# Patient Record
Sex: Female | Born: 1960
Health system: Southern US, Community
[De-identification: ages and names within clinical notes are randomized; demographics above are authoritative.]

## PROBLEM LIST (undated history)

## (undated) DIAGNOSIS — M726 Necrotizing fasciitis: Secondary | ICD-10-CM

## (undated) DIAGNOSIS — R112 Nausea with vomiting, unspecified: Secondary | ICD-10-CM

## (undated) DIAGNOSIS — J42 Unspecified chronic bronchitis: Secondary | ICD-10-CM

## (undated) DIAGNOSIS — J40 Bronchitis, not specified as acute or chronic: Secondary | ICD-10-CM

## (undated) DIAGNOSIS — Z9889 Other specified postprocedural states: Secondary | ICD-10-CM

## (undated) DIAGNOSIS — J189 Pneumonia, unspecified organism: Secondary | ICD-10-CM

## (undated) DIAGNOSIS — I10 Essential (primary) hypertension: Secondary | ICD-10-CM

## (undated) DIAGNOSIS — J45909 Unspecified asthma, uncomplicated: Secondary | ICD-10-CM

## (undated) DIAGNOSIS — S92902A Unspecified fracture of left foot, initial encounter for closed fracture: Secondary | ICD-10-CM

## (undated) DIAGNOSIS — M1990 Unspecified osteoarthritis, unspecified site: Secondary | ICD-10-CM

## (undated) HISTORY — DX: Unspecified chronic bronchitis: J42

## (undated) HISTORY — PX: TONSILLECTOMY: SUR1361

## (undated) HISTORY — PX: OTHER SURGICAL HISTORY: SHX169

## (undated) HISTORY — PX: MANDIBLE SURGERY: SHX707

## (undated) HISTORY — PX: APPENDECTOMY: SHX54

## (undated) HISTORY — DX: Unspecified osteoarthritis, unspecified site: M19.90

## (undated) HISTORY — PX: WISDOM TOOTH EXTRACTION: SHX21

---

## 2017-12-01 ENCOUNTER — Other Ambulatory Visit: Payer: Self-pay

## 2017-12-01 ENCOUNTER — Encounter (HOSPITAL_COMMUNITY): Payer: Self-pay

## 2017-12-01 ENCOUNTER — Emergency Department (HOSPITAL_COMMUNITY)
Admission: EM | Admit: 2017-12-01 | Discharge: 2017-12-01 | Disposition: A | Discharge status: Home / Self Care | Payer: Federal, State, Local not specified - PPO | Attending: Emergency Medicine | Admitting: Emergency Medicine

## 2017-12-01 DIAGNOSIS — N390 Urinary tract infection, site not specified: Secondary | ICD-10-CM | POA: Diagnosis not present

## 2017-12-01 DIAGNOSIS — J45909 Unspecified asthma, uncomplicated: Secondary | ICD-10-CM | POA: Diagnosis not present

## 2017-12-01 DIAGNOSIS — R197 Diarrhea, unspecified: Secondary | ICD-10-CM | POA: Insufficient documentation

## 2017-12-01 DIAGNOSIS — R109 Unspecified abdominal pain: Secondary | ICD-10-CM | POA: Diagnosis present

## 2017-12-01 HISTORY — DX: Unspecified asthma, uncomplicated: J45.909

## 2017-12-01 HISTORY — DX: Necrotizing fasciitis: M72.6

## 2017-12-01 HISTORY — DX: Unspecified fracture of left foot, initial encounter for closed fracture: S92.902A

## 2017-12-01 LAB — URINALYSIS, ROUTINE W REFLEX MICROSCOPIC
Bilirubin Urine: NEGATIVE
GLUCOSE, UA: NEGATIVE mg/dL
HGB URINE DIPSTICK: NEGATIVE
Ketones, ur: NEGATIVE mg/dL
NITRITE: NEGATIVE
PH: 5 (ref 5.0–8.0)
Protein, ur: NEGATIVE mg/dL
SPECIFIC GRAVITY, URINE: 1.012 (ref 1.005–1.030)
WBC, UA: >50 WBC/hpf — ABNORMAL HIGH (ref 0–5)

## 2017-12-01 LAB — COMPREHENSIVE METABOLIC PANEL
ALK PHOS: 73 U/L (ref 38–126)
ALT: 38 U/L (ref 0–44)
AST: 23 U/L (ref 15–41)
Albumin: 4.6 g/dL (ref 3.5–5.0)
Anion gap: 10 (ref 5–15)
BUN: 20 mg/dL (ref 6–20)
CALCIUM: 9.6 mg/dL (ref 8.9–10.3)
CO2: 27 mmol/L (ref 22–32)
CREATININE: 0.7 mg/dL (ref 0.44–1.00)
Chloride: 107 mmol/L (ref 98–111)
Glucose, Bld: 95 mg/dL (ref 70–99)
Potassium: 3.9 mmol/L (ref 3.5–5.1)
Sodium: 144 mmol/L (ref 135–145)
Total Bilirubin: 0.7 mg/dL (ref 0.3–1.2)
Total Protein: 7.6 g/dL (ref 6.5–8.1)

## 2017-12-01 LAB — LIPASE, BLOOD: Lipase: 30 U/L (ref 11–51)

## 2017-12-01 LAB — I-STAT BETA HCG BLOOD, ED (MC, WL, AP ONLY): I-stat hCG, quantitative: <5 m[IU]/mL (ref <5)

## 2017-12-01 LAB — CBC
HCT: 41.3 % (ref 36.0–46.0)
Hemoglobin: 13.6 g/dL (ref 12.0–15.0)
MCH: 26.5 pg (ref 26.0–34.0)
MCHC: 32.9 g/dL (ref 30.0–36.0)
MCV: 80.4 fL (ref 78.0–100.0)
PLATELETS: 353 10*3/uL (ref 150–400)
RBC: 5.14 MIL/uL — AB (ref 3.87–5.11)
RDW: 14.8 % (ref 11.5–15.5)
WBC: 9.2 10*3/uL (ref 4.0–10.5)

## 2017-12-01 LAB — CBG MONITORING, ED: Glucose-Capillary: 90 mg/dL (ref 70–99)

## 2017-12-01 MED ORDER — CEPHALEXIN 500 MG PO CAPS: 500.0000 mg | ORAL_CAPSULE | Freq: Four times a day (QID) | ORAL | 0 refills | Status: DC

## 2017-12-01 MED ORDER — FLUCONAZOLE 150 MG PO TABS: 150.0000 mg | ORAL_TABLET | Freq: Once | ORAL | 0 refills | Status: AC

## 2017-12-01 MED ORDER — CEPHALEXIN 500 MG PO CAPS
500.0000 mg | ORAL_CAPSULE | Freq: Once | ORAL | Status: AC
  Administered 2017-12-01: 500 mg via ORAL
  Filled 2017-12-01: qty 1

## 2017-12-01 MED ORDER — FLUCONAZOLE 150 MG PO TABS
150.0000 mg | ORAL_TABLET | Freq: Once | ORAL | Status: AC
  Administered 2017-12-01: 150 mg via ORAL
  Filled 2017-12-01: qty 1

## 2017-12-01 NOTE — ED Provider Notes (Signed)
[facility]-EMERGENCY DEPT Provider Note   CSN: [id number] Arrival date & time: 12/01/17  1558     History   Chief Complaint Chief Complaint  Patient presents with  . Diarrhea    HPI Toni Jones is a 57 y.o. female.  The history is provided by the patient and medical records. No language interpreter was used.  Diarrhea   Associated symptoms include abdominal pain. Pertinent negatives include no vomiting.   Toni Jones is a 57 y.o. female presents to the emergency department for suprapubic pain for the last 2 weeks.  Associated with some dysuria, however this would improve when she drinks cranberry juice.  The symptoms have been waxing and waning.  Last week, she developed intermittent loose stools.  One day, she had up to 10 episodes of loose stool, however the next day, she only had 2 bowel movements which were more firm.  She had 3 bowel movements this morning, all were formed, however softer than usual.  She has been drinking Kombucha for the probiotic effect.  Denies any fever, chills, back pain, vaginal discharge, nausea, vomiting.  Past Medical History:  Diagnosis Date  . Asthma   . Flesh-eating bacteria (HCC)    [place]  . Foot fracture, left     There are no active problems to display for this patient.   Past Surgical History:  Procedure Laterality Date  . APPENDECTOMY    . TONSILLECTOMY       OB History   None      Home Medications    Prior to Admission medications   Medication Sig Start Date End Date Taking? Authorizing Provider  naproxen sodium (ALEVE) 220 MG tablet Take 220 mg by mouth daily as needed (pain).   Yes [provider]  cephALEXin (KEFLEX) 500 MG capsule Take 1 capsule (500 mg total) by mouth 4 (four) times daily. 12/01/17   Ward, Chase Picket, PA-C  fluconazole (DIFLUCAN) 150 MG tablet Take 1 tablet (150 mg total) by mouth once for 1 dose. Take 1 tablet (150 mg total) by mouth once in 3 days.  12/01/17 12/01/17  Ward, Chase Picket, PA-C    Family History No family history on file.  Social History Social History   Tobacco Use  . Smoking status: Never Smoker  . Smokeless tobacco: Never Used  Substance Use Topics  . Alcohol use: Never    Frequency: Never  . Drug use: Never     Allergies   Onion and Red dye #40 [red dye]   Review of Systems Review of Systems  Gastrointestinal: Positive for abdominal pain and diarrhea. Negative for constipation, nausea and vomiting.  Genitourinary: Positive for dysuria. Negative for vaginal discharge.  All other systems reviewed and are negative.    Physical Exam Updated Vital Signs BP (!) 154/82 (BP Location: Left Arm)   Pulse 65   Temp 98.4 F (36.9 C) (Oral)   Resp 16   Ht 5\' 6"  (1.676 m)   Wt 96.4 kg   SpO2 98%   BMI 34.31 kg/m   Physical Exam  Constitutional: She is oriented to person, place, and time. She appears well-developed and well-nourished. No distress.  Well-appearing.  HENT:  Head: Normocephalic and atraumatic.  Neck: Neck supple.  Cardiovascular: Normal rate, regular rhythm and normal heart sounds.  No murmur heard. Pulmonary/Chest: Effort normal and breath sounds normal. No respiratory distress.  Abdominal: Soft. Bowel sounds are normal. She exhibits no distension.  Mild suprapubic tenderness without rebound  or guarding.  No CVA tenderness.  Musculoskeletal: Normal range of motion.  Neurological: She is alert and oriented to person, place, and time.  Skin: Skin is warm and dry.  Nursing note and vitals reviewed.    ED Treatments / Results  Labs (all labs ordered are listed, but only abnormal results are displayed) Labs Reviewed  CBC - Abnormal; Notable for the following components:      Result Value   RBC 5.14 (*)    All other components within normal limits  URINALYSIS, ROUTINE W REFLEX MICROSCOPIC - Abnormal; Notable for the following components:   Leukocytes, UA LARGE (*)    WBC, UA >50  (*)    Bacteria, UA RARE (*)    All other components within normal limits  LIPASE, BLOOD  COMPREHENSIVE METABOLIC PANEL  CBG MONITORING, ED  I-STAT BETA HCG BLOOD, ED ([facility], [facility], [facility] ONLY)    EKG None  Radiology No results found.  Procedures Procedures (including critical care time)  Medications Ordered in ED Medications  cephALEXin (KEFLEX) capsule 500 mg (has no administration in time range)  fluconazole (DIFLUCAN) tablet 150 mg (has no administration in time range)     Initial Impression / Assessment and Plan / ED Course  I have reviewed the triage vital signs and the nursing notes.  Pertinent labs & imaging results that were available during my care of the patient were reviewed by me and considered in my medical decision making (see chart for details).    Toni Jones is a 57 y.o. female who presents to ED for suprapubic pain, dysuria and intermittent diarrhea over the last 2 weeks.  On exam, patient is afebrile, hemodynamically stable, well-appearing with mild suprapubic tenderness.  Nonsurgical abdomen.  No CVA tenderness.  Blood work reviewed and reassuring with normal white count.  UA consistent with UTI showing large leukocytes and greater than 50 WBCs.  Yeast also present.  Given dose of Diflucan in the ER and Rx for 1 pill to take in 3 days to treat for yeast.  Will prescribe Keflex for UTI.  PCP follow-up if no improvement in 3 days.  Reasons to return to the ER including fever, back pain, vomiting, worsening abdominal pain, new symptoms or any additional concerns discussed.  All questions answered.  Final Clinical Impressions(s) / ED Diagnoses   Final diagnoses:  Lower urinary tract infection    ED Discharge Orders         Ordered    cephALEXin (KEFLEX) 500 MG capsule  4 times daily     12/01/17 2055    fluconazole (DIFLUCAN) 150 MG tablet   Once     12/01/17 2055           Ward, Chase Picket, PA-C 12/01/17 2144    Benjiman Core, MD 12/01/17  [phone]

## 2017-12-01 NOTE — Discharge Instructions (Signed)
It was my pleasure taking care of you today!   Please take all of your antibiotics until finished!  Start using Monostat (vaginal yeast cream) for the next week.   Follow up with your primary care doctor if symptoms are not improving in the next 3 days.   Return to ER for fever, back pain, vomiting, new or worsening symptoms, any additional concerns.

## 2017-12-01 NOTE — ED Triage Notes (Signed)
Pt states that [place] drove [place] country. Pt states that she has been here 3 weeks. Pt states she has had diarrhea since arriving. Pt states that she has been sticking to bottled water, after thinking that she had issues with the water supply. Pt states that she has been trying probiotics without success. Pt states that she also switched from working days to nights.

## 2018-04-06 DIAGNOSIS — K080 Exfoliation of teeth due to systemic causes: Secondary | ICD-10-CM | POA: Diagnosis not present

## 2018-08-01 ENCOUNTER — Other Ambulatory Visit: Payer: Self-pay

## 2018-08-01 ENCOUNTER — Emergency Department (HOSPITAL_COMMUNITY)
Admission: EM | Admit: 2018-08-01 | Discharge: 2018-08-01 | Disposition: A | Discharge status: Home / Self Care | Payer: Federal, State, Local not specified - PPO | Attending: Emergency Medicine | Admitting: Emergency Medicine

## 2018-08-01 ENCOUNTER — Emergency Department (HOSPITAL_COMMUNITY): Payer: Federal, State, Local not specified - PPO

## 2018-08-01 DIAGNOSIS — J40 Bronchitis, not specified as acute or chronic: Secondary | ICD-10-CM

## 2018-08-01 DIAGNOSIS — Z20822 Contact with and (suspected) exposure to covid-19: Secondary | ICD-10-CM

## 2018-08-01 DIAGNOSIS — Z1159 Encounter for screening for other viral diseases: Secondary | ICD-10-CM | POA: Diagnosis not present

## 2018-08-01 DIAGNOSIS — R0602 Shortness of breath: Secondary | ICD-10-CM | POA: Diagnosis not present

## 2018-08-01 DIAGNOSIS — J4521 Mild intermittent asthma with (acute) exacerbation: Secondary | ICD-10-CM | POA: Insufficient documentation

## 2018-08-01 DIAGNOSIS — Z20828 Contact with and (suspected) exposure to other viral communicable diseases: Secondary | ICD-10-CM | POA: Diagnosis not present

## 2018-08-01 LAB — BASIC METABOLIC PANEL
Anion gap: 9 (ref 5–15)
BUN: 21 mg/dL — ABNORMAL HIGH (ref 6–20)
CO2: 25 mmol/L (ref 22–32)
Calcium: 9.6 mg/dL (ref 8.9–10.3)
Chloride: 106 mmol/L (ref 98–111)
Creatinine, Ser: 0.88 mg/dL (ref 0.44–1.00)
GFR calc Af Amer: >60 mL/min (ref >60)
GFR calc non Af Amer: >60 mL/min (ref >60)
Glucose, Bld: 99 mg/dL (ref 70–99)
Potassium: 4 mmol/L (ref 3.5–5.1)
Sodium: 140 mmol/L (ref 135–145)

## 2018-08-01 LAB — TROPONIN I: Troponin I: <0.03 ng/mL (ref <0.03)

## 2018-08-01 LAB — SARS CORONAVIRUS 2 BY RT PCR (HOSPITAL ORDER, PERFORMED IN ~~LOC~~ HOSPITAL LAB): SARS Coronavirus 2: NEGATIVE

## 2018-08-01 LAB — BRAIN NATRIURETIC PEPTIDE: B Natriuretic Peptide: 54 pg/mL (ref 0.0–100.0)

## 2018-08-01 MED ORDER — ALBUTEROL SULFATE HFA 108 (90 BASE) MCG/ACT IN AERS
4.0000 | INHALATION_SPRAY | Freq: Once | RESPIRATORY_TRACT | Status: AC
  Administered 2018-08-01: 10:00:00 4 via RESPIRATORY_TRACT
  Filled 2018-08-01: qty 6.7

## 2018-08-01 MED ORDER — AZITHROMYCIN 250 MG PO TABS
500.0000 mg | ORAL_TABLET | Freq: Once | ORAL | Status: AC
  Administered 2018-08-01: 500 mg via ORAL
  Filled 2018-08-01: qty 2

## 2018-08-01 MED ORDER — ALBUTEROL SULFATE HFA 108 (90 BASE) MCG/ACT IN AERS: 1.0000 | INHALATION_SPRAY | Freq: Four times a day (QID) | RESPIRATORY_TRACT | 0 refills | Status: DC | PRN

## 2018-08-01 MED ORDER — AZITHROMYCIN 250 MG PO TABS: ORAL_TABLET | ORAL | 0 refills | Status: DC

## 2018-08-01 MED ORDER — AEROCHAMBER Z-STAT PLUS/MEDIUM MISC
1.0000 | Freq: Once | Status: AC
  Administered 2018-08-01: 1
  Filled 2018-08-01: qty 1

## 2018-08-01 MED ORDER — METHYLPREDNISOLONE SODIUM SUCC 125 MG IJ SOLR
125.0000 mg | Freq: Once | INTRAMUSCULAR | Status: AC
  Administered 2018-08-01: 125 mg via INTRAVENOUS
  Filled 2018-08-01: qty 2

## 2018-08-01 MED ORDER — PREDNISONE 10 MG (21) PO TBPK: ORAL_TABLET | ORAL | 0 refills | Status: DC

## 2018-08-01 NOTE — ED Provider Notes (Signed)
[facility]-EMERGENCY DEPT Provider Note   CSN: [id number] Arrival date & time: 08/01/18  0940    History   Chief Complaint Chief Complaint  Patient presents with  . Shortness of Breath    hx ashtma    HPI Toni Jones is a 58 y.o. female.     Pt presents to the ED today with sob.  She has a hx of asthma and feels like she is having an exacerbation of her asthma.  She has been using her inhaler, but it has not been helping.  She works for [employer], and is worried she's been exposed to [employer].  She has not had any f/c.  She has no known exposures to covid.     Past Medical History:  Diagnosis Date  . Asthma   . Flesh-eating bacteria (HCC)    [place]  . Foot fracture, left     There are no active problems to display for this patient.   Past Surgical History:  Procedure Laterality Date  . APPENDECTOMY    . TONSILLECTOMY       OB History   No obstetric history on file.      Home Medications    Prior to Admission medications   Medication Sig Start Date End Date Taking? Authorizing Provider  albuterol (VENTOLIN HFA) 108 (90 Base) MCG/ACT inhaler Inhale 1-2 puffs into the lungs every 6 (six) hours as needed for wheezing or shortness of breath. 08/01/18   Jacalyn Lefevre, MD  azithromycin (ZITHROMAX) 250 MG tablet Take 1 every day until finished. 08/01/18   Jacalyn Lefevre, MD  cephALEXin (KEFLEX) 500 MG capsule Take 1 capsule (500 mg total) by mouth 4 (four) times daily. Patient not taking: Reported on 08/01/2018 12/01/17   Ward, Chase Picket, PA-C  naproxen sodium (ALEVE) 220 MG tablet Take 220 mg by mouth daily as needed (pain).    [provider]  predniSONE (STERAPRED UNI-PAK 21 TAB) 10 MG (21) TBPK tablet Take 6 tabs for 2 days, then 5 for 2 days, then 4 for 2 days, then 3 for 2 days, 2 for 2 days, then 1 for 2 days 08/01/18   Jacalyn Lefevre, MD    Family History No family history on file.  Social History Social  History   Tobacco Use  . Smoking status: Never Smoker  . Smokeless tobacco: Never Used  Substance Use Topics  . Alcohol use: Never    Frequency: Never  . Drug use: Never     Allergies   Onion and Red dye #40 [red dye]   Review of Systems Review of Systems  Respiratory: Positive for shortness of breath and wheezing.   All other systems reviewed and are negative.    Physical Exam Updated Vital Signs BP (!) 149/74   Pulse 72   Temp 98 F (36.7 C) (Oral)   Resp (!) 30   Ht 5\' 6"  (1.676 m)   Wt 99.8 kg   SpO2 98%   BMI 35.51 kg/m   Physical Exam Vitals signs and nursing note reviewed.  Constitutional:      Appearance: She is well-developed.  HENT:     Head: Normocephalic and atraumatic.     Mouth/Throat:     Mouth: Mucous membranes are moist.     Pharynx: Oropharynx is clear.  Eyes:     Extraocular Movements: Extraocular movements intact.     Pupils: Pupils are equal, round, and reactive to light.  Neck:  Musculoskeletal: Normal range of motion and neck supple.  Cardiovascular:     Rate and Rhythm: Normal rate and regular rhythm.  Pulmonary:     Effort: Tachypnea present.     Breath sounds: Wheezing present.  Abdominal:     General: Bowel sounds are normal.     Palpations: Abdomen is soft.  Musculoskeletal: Normal range of motion.  Skin:    General: Skin is warm and dry.     Capillary Refill: Capillary refill takes less than 2 seconds.  Neurological:     General: No focal deficit present.     Mental Status: She is alert and oriented to person, place, and time.  Psychiatric:        Mood and Affect: Mood normal.        Behavior: Behavior normal.      ED Treatments / Results  Labs (all labs ordered are listed, but only abnormal results are displayed) Labs Reviewed  BASIC METABOLIC PANEL - Abnormal; Notable for the following components:      Result Value   BUN 21 (*)    All other components within normal limits  SARS CORONAVIRUS 2 (HOSPITAL  ORDER, PERFORMED IN [facility])  BRAIN NATRIURETIC PEPTIDE  TROPONIN I    EKG EKG Interpretation  Date/Time:  Tuesday Aug 01 2018 10:23:41 EDT Ventricular Rate:  69 PR Interval:    QRS Duration: 94 QT Interval:  392 QTC Calculation: 420 R Axis:   14 Text Interpretation:  Sinus rhythm Confirmed by Jacalyn Lefevre [phone]) on 08/01/2018 10:41:22 AM   Radiology Dg Chest Port 1 View  Result Date: 08/01/2018 CLINICAL DATA:  Shortness of breath over the last several days. History of asthma. EXAM: PORTABLE CHEST 1 VIEW COMPARISON:  None. FINDINGS: 1014 hours. The heart size and mediastinal contours are normal. The lungs are clear. There is no pleural effusion or pneumothorax. No acute osseous findings are identified. Telemetry leads overlie the chest. IMPRESSION: No active cardiopulmonary process. Electronically Signed   By: Carey Bullocks M.D.   On: 08/01/2018 10:58    Procedures Procedures (including critical care time)  Medications Ordered in ED Medications  albuterol (VENTOLIN HFA) 108 (90 Base) MCG/ACT inhaler 4 puff (4 puffs Inhalation Given 08/01/18 1026)  aerochamber Z-Stat Plus/medium 1 each (1 each Other Given 08/01/18 1026)  methylPREDNISolone sodium succinate (SOLU-MEDROL) 125 mg/2 mL injection 125 mg (125 mg Intravenous Given 08/01/18 1027)  azithromycin (ZITHROMAX) tablet 500 mg (500 mg Oral Given 08/01/18 1201)     Initial Impression / Assessment and Plan / ED Course  I have reviewed the triage vital signs and the nursing notes.  Pertinent labs & imaging results that were available during my care of the patient were reviewed by me and considered in my medical decision making (see chart for details).    Pt is feeling much better after albuterol and solumedrol.  Covid is negative.  Pt's CXR is negative.  She has had productive sputum, so she will be started on a zpack.  She is d/c home with prednisone, zithromax, and another rx for albuterol.    She knows to  return if worse.  F/u with pcp when she gets home.  Toni Jones was evaluated in Emergency Department on 08/01/2018 for the symptoms described in the history of present illness. She was evaluated in the context of the global COVID-19 pandemic, which necessitated consideration that the patient might be at risk for infection with the SARS-CoV-2 virus that causes COVID-19. Institutional protocols  and algorithms that pertain to the evaluation of patients at risk for COVID-19 are in a state of rapid change based on information released by regulatory bodies including the CDC and federal and state organizations. These policies and algorithms were followed during the patient's care in the ED.  Final Clinical Impressions(s) / ED Diagnoses   Final diagnoses:  Mild intermittent asthma with exacerbation  Covid-19 Virus not Detected  Bronchitis    ED Discharge Orders         Ordered    predniSONE (STERAPRED UNI-PAK 21 TAB) 10 MG (21) TBPK tablet     08/01/18 1151    azithromycin (ZITHROMAX) 250 MG tablet     08/01/18 1151    albuterol (VENTOLIN HFA) 108 (90 Base) MCG/ACT inhaler  Every 6 hours PRN     08/01/18 1154           Jacalyn LefevreHaviland, [place], MD 08/01/18 1207

## 2018-08-01 NOTE — ED Notes (Signed)
Pt d/c home per MD order. Discharge summary reviewed, pt verbalizes understanding. RX provided. Pt ambulatory off unit. No s/s of distress noted.

## 2018-08-01 NOTE — ED Triage Notes (Signed)
Pt to ED from home, reports shortness of breath over past couple days, reports hx of asthma. Reports she feels she has bronchitis. Reports she works for [employer] and her employer is concerned d/t contact with general public. Pt reports to her knowledge she has not been around anyone with Covid-19. Pt denies fever, loss of smell, N&V, sore throat. Reports SHOB, cough, congestion.

## 2018-09-27 DIAGNOSIS — I10 Essential (primary) hypertension: Secondary | ICD-10-CM | POA: Diagnosis not present

## 2018-09-27 DIAGNOSIS — R079 Chest pain, unspecified: Secondary | ICD-10-CM | POA: Diagnosis not present

## 2018-09-27 DIAGNOSIS — H538 Other visual disturbances: Secondary | ICD-10-CM | POA: Diagnosis not present

## 2018-12-07 ENCOUNTER — Encounter (HOSPITAL_COMMUNITY): Payer: Self-pay | Admitting: Emergency Medicine

## 2018-12-07 ENCOUNTER — Emergency Department (HOSPITAL_COMMUNITY)
Admission: EM | Admit: 2018-12-07 | Discharge: 2018-12-07 | Disposition: A | Discharge status: Home / Self Care | Payer: Federal, State, Local not specified - PPO | Attending: Emergency Medicine | Admitting: Emergency Medicine

## 2018-12-07 ENCOUNTER — Other Ambulatory Visit: Payer: Self-pay

## 2018-12-07 DIAGNOSIS — R05 Cough: Secondary | ICD-10-CM | POA: Diagnosis not present

## 2018-12-07 DIAGNOSIS — Z20828 Contact with and (suspected) exposure to other viral communicable diseases: Secondary | ICD-10-CM | POA: Diagnosis not present

## 2018-12-07 DIAGNOSIS — R197 Diarrhea, unspecified: Secondary | ICD-10-CM | POA: Insufficient documentation

## 2018-12-07 DIAGNOSIS — R059 Cough, unspecified: Secondary | ICD-10-CM

## 2018-12-07 LAB — SARS CORONAVIRUS 2 BY RT PCR (HOSPITAL ORDER, PERFORMED IN ~~LOC~~ HOSPITAL LAB): SARS Coronavirus 2: NEGATIVE

## 2018-12-07 MED ORDER — PREDNISONE 10 MG (21) PO TBPK: ORAL_TABLET | Freq: Every day | ORAL | 0 refills | Status: DC

## 2018-12-07 MED ORDER — ALBUTEROL SULFATE HFA 108 (90 BASE) MCG/ACT IN AERS
2.0000 | INHALATION_SPRAY | RESPIRATORY_TRACT | Status: DC
  Administered 2018-12-07: 2 via RESPIRATORY_TRACT
  Filled 2018-12-07: qty 6.7

## 2018-12-07 NOTE — Discharge Instructions (Addendum)
You were seen in the ED for cough.  I suspect you have a virus. We tested your for COVID-19 (coronavirus) infection but this was NEGATIVE. It is possible you have another virus infection.  It is also possible this was early in the illness and this test is FALSE NEGATIVE.  If symptoms are not improving you may need to get re-tested.  Since COVID test is negative treatment of your illness and symptoms will include self-isolation for the next 2-3 days, monitoring of symptoms and supportive care with over-the-counter medicines.    Return to the ED if there is increased work of breathing, shortness of breath, inability to tolerate fluids, weakness, chest pain.  If your test is NEGATIVE, you may return to work and essential activities as long as your symptoms have improved and you do not have a fever for a total of 3 days.  Call your job and notify them that your test result was negative to see if they will allow you to return to work.   Stay well-hydrated. Rest. You can use over the counter medications to help with symptoms: 600 mg ibuprofen (motrin, aleve, advil) or acetaminophen (tylenol) every 6 hours, around the clock to help with associated fevers, sore throat, headaches, generalized body aches and malaise.  Oxymetazoline (afrin) intranasal spray once daily for no more than 3 days to help with congestion, after 3 days you can switch to another over-the-counter nasal steroid spray such as fluticasone (flonase) Allergy medication (loratadine, cetirizine, etc) and phenylephrine (sudafed) help with nasal congestion, runny nose and postnasal drip.   Dextromethorphan (Delsym) to suppress dry cough. Frequent coughing is likely causing your chest wall pain Wash your hands often to prevent spread.   Use your albuterol inhaler every 4 hours for cough. Take over the counter delsym to suppress cough.  We will cover you with a short course of steroids for possible early asthma flare.    Infection Prevention  Recommendations for Individuals Confirmed to have, or Being Evaluated for, or have symptoms of 2019 Novel Coronavirus (COVID-19) Infection Who Receive Care at Home  Individuals who are confirmed to have, or are being evaluated for, COVID-19 should follow the prevention steps below until a healthcare provider or local or state health department says they can return to normal activities.  Stay home except to get medical care You should restrict activities outside your home, except for getting medical care. Do not go to work, school, or public areas, and do not use public transportation or taxis.  Call ahead before visiting your doctor Before your medical appointment, call the healthcare provider and tell them that you have, or are being evaluated for, COVID-19 infection. This will help the healthcare providers office take steps to keep other people from getting infected. Ask your healthcare provider to call the local or state health department.  Monitor your symptoms Seek prompt medical attention if your illness is worsening (e.g., difficulty breathing). Before going to your medical appointment, call the healthcare provider and tell them that you have, or are being evaluated for, COVID-19 infection. Ask your healthcare provider to call the local or state health department.  Wear a facemask You should wear a facemask that covers your nose and mouth when you are in the same room with other people and when you visit a healthcare provider. People who live with or visit you should also wear a facemask while they are in the same room with you.  Separate yourself from other people in your home As  much as possible, you should stay in a different room from other people in your home. Also, you should use a separate bathroom, if available.  Avoid sharing household items You should not share dishes, drinking glasses, cups, eating utensils, towels, bedding, or other items with other people in your home.  After using these items, you should wash them thoroughly with soap and water.  Cover your coughs and sneezes Cover your mouth and nose with a tissue when you cough or sneeze, or you can cough or sneeze into your sleeve. Throw used tissues in a lined trash can, and immediately wash your hands with soap and water for at least 20 seconds or use an alcohol-based hand rub.  Wash your [employer] Corporationhands Wash your hands often and thoroughly with soap and water for at least 20 seconds. You can use an alcohol-based hand sanitizer if soap and water are not available and if your hands are not visibly dirty. Avoid touching your eyes, nose, and mouth with unwashed hands.   Prevention Steps for Caregivers and Household Members of Individuals Confirmed to have, or Being Evaluated for, or have symptoms of 2019 Novel Coronavirus (COVID-19) Infection Being Cared for in the Home  If you live with, or provide care at home for, a person confirmed to have, or being evaluated for, COVID-19 infection please follow these guidelines to prevent infection:  Follow healthcare providers instructions Make sure that you understand and can help the patient follow any healthcare provider instructions for all care.  Provide for the patients basic needs You should help the patient with basic needs in the home and provide support for getting groceries, prescriptions, and other personal needs.  Monitor the patients symptoms If they are getting sicker, call his or her medical provider and tell them that the patient has, or is being evaluated for, COVID-19 infection. This will help the healthcare providers office take steps to keep other people from getting infected. Ask the healthcare provider to call the local or state health department.  Limit the number of people who have contact with the patient If possible, have only one caregiver for the patient. Other household members should stay in another home or place of residence. If this is  not possible, they should stay in another room, or be separated from the patient as much as possible. Use a separate bathroom, if available. Restrict visitors who do not have an essential need to be in the home.  Keep older adults, very young children, and other sick people away from the patient Keep older adults, very young children, and those who have compromised immune systems or chronic health conditions away from the patient. This includes people with chronic heart, lung, or kidney conditions, diabetes, and cancer.  Ensure good ventilation Make sure that shared spaces in the home have good air flow, such as from an air conditioner or an opened window, weather permitting.  Wash your hands often Wash your hands often and thoroughly with soap and water for at least 20 seconds. You can use an alcohol based hand sanitizer if soap and water are not available and if your hands are not visibly dirty. Avoid touching your eyes, nose, and mouth with unwashed hands. Use disposable paper towels to dry your hands. If not available, use dedicated cloth towels and replace them when they become wet.  Wear a facemask and gloves Wear a disposable facemask at all times in the room and gloves when you touch or have contact with the patients blood, body  fluids, and/or secretions or excretions, such as sweat, saliva, sputum, nasal mucus, vomit, urine, or feces.  Ensure the mask fits over your nose and mouth tightly, and do not touch it during use. Throw out disposable facemasks and gloves after using them. Do not reuse. Wash your hands immediately after removing your facemask and gloves. If your personal clothing becomes contaminated, carefully remove clothing and launder. Wash your hands after handling contaminated clothing. Place all used disposable facemasks, gloves, and other waste in a lined container before disposing them with other household waste. Remove gloves and wash your hands immediately after  handling these items.  Do not share dishes, glasses, or other household items with the patient Avoid sharing household items. You should not share dishes, drinking glasses, cups, eating utensils, towels, bedding, or other items with a patient who is confirmed to have, or being evaluated for, COVID-19 infection. After the person uses these items, you should wash them thoroughly with soap and water.  Wash laundry thoroughly Immediately remove and wash clothes or bedding that have blood, body fluids, and/or secretions or excretions, such as sweat, saliva, sputum, nasal mucus, vomit, urine, or feces, on them. Wear gloves when handling laundry from the patient. Read and follow directions on labels of laundry or clothing items and detergent. In general, wash and dry with the warmest temperatures recommended on the label.  Clean all areas the individual has used often Clean all touchable surfaces, such as counters, tabletops, doorknobs, bathroom fixtures, toilets, phones, keyboards, tablets, and bedside tables, every day. Also, clean any surfaces that may have blood, body fluids, and/or secretions or excretions on them. Wear gloves when cleaning surfaces the patient has come in contact with. Use a diluted bleach solution (e.g., dilute bleach with 1 part bleach and 10 parts water) or a household disinfectant with a label that says EPA-registered for coronaviruses. To make a bleach solution at home, add 1 tablespoon of bleach to 1 quart (4 cups) of water. For a larger supply, add  cup of bleach to 1 gallon (16 cups) of water. Read labels of cleaning products and follow recommendations provided on product labels. Labels contain instructions for safe and effective use of the cleaning product including precautions you should take when applying the product, such as wearing gloves or eye protection and making sure you have good ventilation during use of the product. Remove gloves and wash hands immediately after  cleaning.  Monitor yourself for signs and symptoms of illness Caregivers and household members are considered close contacts, should monitor their health, and will be asked to limit movement outside of the home to the extent possible. Follow the monitoring steps for close contacts listed on the symptom monitoring form.  ? If you have additional questions, contact your local health department or call the epidemiologist on call at [phone] (available 24/7). ? This guidance is subject to change. For the most up-to-date guidance from [facility], please refer to their website: [email or web address].hu  See below for community resources where you may get tested for COVID   Link to [facility] for COVID testing and information: horwitzweb.com No cost COVID-19 test available  Website includes testing dates, times, resources  Health department may be able to do "administrative" COVID tests to return/obtain clearance to work  Link to [employer] for COVID-19: achegone.com Website includes contact information and addresses, testing requirements and cost Hours: Monday - Friday, 8 a.m. - 3:30 p.m   Local pharmacies (I[facility]e: [facility], [facility]) also offering COVID  testing with some cost

## 2018-12-07 NOTE — ED Triage Notes (Signed)
Pt reports that she has asthma and started having a deep cough and then had some back aches this morning that were relieved with tylenol. Reports that yesterday had some loose stools but wasn't diarrhea. Her work is needing to rule out Covid and get cleared before can return.

## 2018-12-07 NOTE — ED Provider Notes (Signed)
[facility]-EMERGENCY DEPT Provider Note   CSN: [id number] Arrival date & time: 12/07/18  [id number]     History   Chief Complaint Chief Complaint  Patient presents with  . note to return to work-Covid test  . Cough    HPI Toni Jones is a 58 y.o. female with history of asthma here for evaluation of COVID-19 testing required by her job.  Patient reports for the last couple of days she has had a new cough.  Yesterday the cough was deep and "wet" but this morning she woke up and it felt like it was much higher up just in her throat and dry without sputum.  Has wheezing only during her coughing episodes.  Yesterday she had a mild headache and had 4 bowel movements that were looser but no frank diarrhea.  She woke up this morning and had mild low back pain.  States usually she swims every day and had some not have back issues, she took Tylenol and the back pain has resolved.  She mentioned her symptoms at work today and her job required her to be tested for COVID-19.  [place] in contact with several people a day.  She is cautious and wears facemask, shield and gloves.  Typically uses albuterol as needed and allergy medicine.  Denies fevers, chills, congestion, sore throat, chest pain, shortness of breath, vomiting, myalgias, abdominal pain. HPI  Past Medical History:  Diagnosis Date  . Asthma   . Flesh-eating bacteria (HCC)    [place]  . Foot fracture, left     There are no active problems to display for this patient.   Past Surgical History:  Procedure Laterality Date  . APPENDECTOMY    . TONSILLECTOMY       OB History   No obstetric history on file.      Home Medications    Prior to Admission medications   Medication Sig Start Date End Date Taking? Authorizing Provider  albuterol (VENTOLIN HFA) 108 (90 Base) MCG/ACT inhaler Inhale 1-2 puffs into the lungs every 6 (six) hours as needed for wheezing or shortness of  breath. 08/01/18   Jacalyn LefevreHaviland, Julie, MD  azithromycin (ZITHROMAX) 250 MG tablet Take 1 every day until finished. 08/01/18   Jacalyn LefevreHaviland, Julie, MD  cephALEXin (KEFLEX) 500 MG capsule Take 1 capsule (500 mg total) by mouth 4 (four) times daily. Patient not taking: Reported on 08/01/2018 12/01/17   Ward, Chase PicketJaime Pilcher, PA-C  naproxen sodium (ALEVE) 220 MG tablet Take 220 mg by mouth daily as needed (pain).    [provider]  predniSONE (STERAPRED UNI-PAK 21 TAB) 10 MG (21) TBPK tablet Take by mouth daily. Take 6 tabs by mouth daily  for 2 days, then 5 tabs for 2 days, then 4 tabs for 2 days, then 3 tabs for 2 days, 2 tabs for 2 days, then 1 tab by mouth daily for 2 days 12/07/18   Liberty HandyGibbons, Claudia J, PA-C    Family History No family history on file.  Social History Social History   Tobacco Use  . Smoking status: Never Smoker  . Smokeless tobacco: Never Used  Substance Use Topics  . Alcohol use: Never    Frequency: Never  . Drug use: Never     Allergies   Onion and Red dye #40 [red dye]   Review of Systems Review of Systems  Respiratory: Positive for cough and wheezing.   Gastrointestinal: Positive for diarrhea.  Musculoskeletal:  Positive for back pain (Resolved).  Neurological: Positive for headaches (Resolved).  All other systems reviewed and are negative.    Physical Exam Updated Vital Signs BP (!) 169/110 (BP Location: Left Arm)   Pulse 86   Temp 98.4 F (36.9 C) (Oral)   Resp 17   SpO2 97%   Physical Exam Vitals signs and nursing note reviewed.  Constitutional:      General: She is not in acute distress.    Appearance: She is well-developed.  HENT:     Head: Normocephalic and atraumatic.     Right Ear: External ear normal.     Left Ear: External ear normal.     Nose: Nose normal.  Eyes:     Conjunctiva/sclera: Conjunctivae normal.  Neck:     Musculoskeletal: Normal range of motion and neck supple.  Cardiovascular:     Rate and Rhythm: Normal rate and  regular rhythm.     Heart sounds: Normal heart sounds.  Pulmonary:     Effort: Pulmonary effort is normal.     Breath sounds: Normal breath sounds. No wheezing.  Musculoskeletal: Normal range of motion.  Skin:    General: Skin is warm and dry.     Capillary Refill: Capillary refill takes less than 2 seconds.  Neurological:     Mental Status: She is alert and oriented to person, place, and time.  Psychiatric:        Behavior: Behavior normal.        Thought Content: Thought content normal.        Judgment: Judgment normal.      ED Treatments / Results  Labs (all labs ordered are listed, but only abnormal results are displayed) Labs Reviewed  SARS CORONAVIRUS 2 (HOSPITAL ORDER, [facility])    EKG None  Radiology No results found.  Procedures Procedures (including critical care time)  Medications Ordered in ED Medications  albuterol (VENTOLIN HFA) 108 (90 Base) MCG/ACT inhaler 2 puff (2 puffs Inhalation Given 12/07/18 1440)     Initial Impression / Assessment and Plan / ED Course  I have reviewed the triage vital signs and the nursing notes.  Pertinent labs & imaging results that were available during my care of the patient were reviewed by me and considered in my medical decision making (see chart for details).  58 year old female with asthma here with cough.  Also reports headache, back pain, diarrhea.  [place]  She is concerned about COVID-19.  Suspect viral illness, influenza, COVID-19.  She could also be having early asthma exacerbation.  Exam is benign.  Normal work of breathing.  No wheezing on exam.  Afebrile.  Vital signs normal.  COVID-19 rapid test ordered by mistake, meant to order send out test.  COVID-19 is negative.  I do not think chest x-ray is indicated today as her exam is benign.  She has no signs of consolidation on auscultation.  No hypoxia.  No constitutional symptoms.  I doubt bacterial bronchitis,  pneumonia.  Given her underlying asthma, she could be having bronchospasms.  We will discharge with albuterol inhaler, antitussives, prednisone taper.  I do not think there is indication for antibiotics.  She is not producing any sputum.  Discussed with patient importance of self-isolation and monitoring her symptoms over the next 2 to 3 days.  COVID test was done early in the illness and could be false negative.  Recommended retesting if her symptoms are not improving.  Return precautions  discussed.  Patient is comfortable with this.  Final Clinical Impressions(s) / ED Diagnoses   Final diagnoses:  Cough    ED Discharge Orders         Ordered    predniSONE (STERAPRED UNI-PAK 21 TAB) 10 MG (21) TBPK tablet  Daily     12/07/18 1411           Liberty Handy, PA-C 12/07/18 1457    Tilden Fossa, MD 12/07/18 [phone]

## 2019-01-31 ENCOUNTER — Other Ambulatory Visit: Payer: Self-pay

## 2019-01-31 ENCOUNTER — Emergency Department (HOSPITAL_COMMUNITY): Payer: Federal, State, Local not specified - PPO

## 2019-01-31 ENCOUNTER — Encounter (HOSPITAL_COMMUNITY): Payer: Self-pay

## 2019-01-31 ENCOUNTER — Emergency Department (HOSPITAL_COMMUNITY)
Admission: EM | Admit: 2019-01-31 | Discharge: 2019-01-31 | Disposition: A | Discharge status: Home / Self Care | Payer: Federal, State, Local not specified - PPO | Attending: Emergency Medicine | Admitting: Emergency Medicine

## 2019-01-31 DIAGNOSIS — Z20828 Contact with and (suspected) exposure to other viral communicable diseases: Secondary | ICD-10-CM | POA: Diagnosis not present

## 2019-01-31 DIAGNOSIS — J4520 Mild intermittent asthma, uncomplicated: Secondary | ICD-10-CM | POA: Diagnosis not present

## 2019-01-31 DIAGNOSIS — R05 Cough: Secondary | ICD-10-CM | POA: Diagnosis not present

## 2019-01-31 MED ORDER — PREDNISONE 20 MG PO TABS: 60.0000 mg | ORAL_TABLET | Freq: Every day | ORAL | 0 refills | Status: DC

## 2019-01-31 MED ORDER — PREDNISONE 20 MG PO TABS
60.0000 mg | ORAL_TABLET | Freq: Once | ORAL | Status: AC
  Administered 2019-01-31: 11:00:00 60 mg via ORAL
  Filled 2019-01-31: qty 3

## 2019-01-31 MED ORDER — ALBUTEROL SULFATE HFA 108 (90 BASE) MCG/ACT IN AERS
6.0000 | INHALATION_SPRAY | Freq: Once | RESPIRATORY_TRACT | Status: AC
  Administered 2019-01-31: 6 via RESPIRATORY_TRACT
  Filled 2019-01-31: qty 6.7

## 2019-01-31 NOTE — ED Provider Notes (Signed)
[facility] DEPT Provider Note   CSN: [id number] Arrival date & time: 01/31/19  0940     History   Chief Complaint Chief Complaint  Patient presents with  . Bronchitis    HPI Toni Jones is a 58 y.o. female.     The history is provided by the patient.  URI Presenting symptoms: cough   Presenting symptoms: no ear pain, no fever and no sore throat   Severity:  Mild Onset quality:  Gradual Timing:  Intermittent Progression:  Waxing and waning Chronicity:  Recurrent Relieved by:  Inhaler Worsened by:  Nothing Associated symptoms: wheezing   Associated symptoms: no arthralgias, no myalgias and no neck pain   Risk factors: chronic respiratory disease   Risk factors comment:  Hx asthma   Past Medical History:  Diagnosis Date  . Asthma   . Flesh-eating bacteria ([place]    [place]  . Foot fracture, left     There are no active problems to display for this patient.   Past Surgical History:  Procedure Laterality Date  . APPENDECTOMY    . TONSILLECTOMY       OB History   No obstetric history on file.      Home Medications    Prior to Admission medications   Medication Sig Start Date End Date Taking? Authorizing Provider  albuterol (VENTOLIN HFA) 108 (90 Base) MCG/ACT inhaler Inhale 1-2 puffs into the lungs every 6 (six) hours as needed for wheezing or shortness of breath. 08/01/18  Yes Isla Pence, MD  naproxen sodium (ALEVE) 220 MG tablet Take 220 mg by mouth daily as needed (pain).   Yes [provider]  sodium chloride (OCEAN) 0.65 % SOLN nasal spray Place 1 spray into both nostrils as needed for congestion.   Yes [provider]  azithromycin (ZITHROMAX) 250 MG tablet Take 1 every day until finished. Patient not taking: Reported on 01/31/2019 08/01/18   Isla Pence, MD  cephALEXin (KEFLEX) 500 MG capsule Take 1 capsule (500 mg total) by mouth 4 (four) times daily. Patient not taking: Reported  on 08/01/2018 12/01/17   Ward, Ozella Almond, PA-C  predniSONE (DELTASONE) 20 MG tablet Take 3 tablets (60 mg total) by mouth daily for 4 days. 01/31/19 02/04/19  Lennice Sites, DO    Family History History reviewed. No pertinent family history.  Social History Social History   Tobacco Use  . Smoking status: Never Smoker  . Smokeless tobacco: Never Used  Substance Use Topics  . Alcohol use: Never    Frequency: Never  . Drug use: Never     Allergies   Onion and Red dye #40 [red dye]   Review of Systems Review of Systems  Constitutional: Negative for chills and fever.  HENT: Negative for ear pain and sore throat.   Eyes: Negative for pain and visual disturbance.  Respiratory: Positive for cough and wheezing. Negative for shortness of breath.   Cardiovascular: Negative for chest pain and palpitations.  Gastrointestinal: Negative for abdominal pain and vomiting.  Genitourinary: Negative for dysuria and hematuria.  Musculoskeletal: Negative for arthralgias, back pain, myalgias and neck pain.  Skin: Negative for color change and rash.  Neurological: Negative for seizures and syncope.  All other systems reviewed and are negative.    Physical Exam Updated Vital Signs  ED Triage Vitals [01/31/19 0951]  Enc Vitals Group     BP (!) 195/80     Pulse Rate 80     Resp 18  Temp 98.3 F (36.8 C)     Temp Source Oral     SpO2 100 %     Weight      Height      Head Circumference      Peak Flow      Pain Score 4     Pain Loc      Pain Edu?      Excl. in GC?     Physical Exam Vitals signs and nursing note reviewed.  Constitutional:      General: She is not in acute distress.    Appearance: She is well-developed. She is not ill-appearing.  HENT:     Head: Normocephalic and atraumatic.     Nose: Nose normal.     Mouth/Throat:     Mouth: Mucous membranes are moist.  Eyes:     Extraocular Movements: Extraocular movements intact.     Conjunctiva/sclera: Conjunctivae  normal.     Pupils: Pupils are equal, round, and reactive to light.  Neck:     Musculoskeletal: Neck supple.  Cardiovascular:     Rate and Rhythm: Normal rate and regular rhythm.     Pulses: Normal pulses.     Heart sounds: Normal heart sounds. No murmur.  Pulmonary:     Effort: Pulmonary effort is normal. No respiratory distress.     Breath sounds: Wheezing present.  Abdominal:     Palpations: Abdomen is soft.     Tenderness: There is no abdominal tenderness.  Skin:    General: Skin is warm and dry.     Capillary Refill: Capillary refill takes less than 2 seconds.  Neurological:     Mental Status: She is alert.  Psychiatric:        Mood and Affect: Mood normal.      ED Treatments / Results  Labs (all labs ordered are listed, but only abnormal results are displayed) Labs Reviewed  NOVEL CORONAVIRUS, NAA (HOSP ORDER, SEND-OUT TO REF LAB; TAT 18-24 HRS)    EKG None  Radiology Dg Chest Portable 1 View  Result Date: 01/31/2019 CLINICAL DATA:  Onset nonproductive cough today. EXAM: PORTABLE CHEST 1 VIEW COMPARISON:  Single-view of the chest 08/01/2018. FINDINGS: Lungs clear. Heart size normal. No pneumothorax or pleural fluid. No acute or focal bony abnormality. IMPRESSION: Negative chest. Electronically Signed   By: Drusilla Kanner M.D.   On: 01/31/2019 11:28    Procedures Procedures (including critical care time)  Medications Ordered in ED Medications  predniSONE (DELTASONE) tablet 60 mg (60 mg Oral Given 01/31/19 1047)  albuterol (VENTOLIN HFA) 108 (90 Base) MCG/ACT inhaler 6 puff (6 puffs Inhalation Given 01/31/19 1047)     Initial Impression / Assessment and Plan / ED Course  I have reviewed the triage vital signs and the nursing notes.  Pertinent labs & imaging results that were available during my care of the patient were reviewed by me and considered in my medical decision making (see chart for details).     Toni Jones is a 58 year old female history  of asthma who presents to the ED with cough, asthma exacerbation.  Patient with overall unremarkable vitals.  No fever.  Has had cough, congestion for the last 2 days.  Has used inhaler with some relief.  No history of coronavirus.  No known exposure to coronavirus.  Has some wheezing on exam.  However no hypoxia both at rest and with ambulation. Will obtain chest x-ray and coronavirus test.  Will give prednisone and albuterol.  Anticipate discharge to home.  Likely asthma exacerbation possibly in the setting of viral process versus weather changes.  Chest x-ray shows no signs of infection.  Patient felt better after breathing treatment.  Likely asthma exacerbation.  Understands return precautions.  Recommend self-isolation until Covid test has resulted.  This chart was dictated using voice recognition software.  Despite best efforts to proofread,  errors can occur which can change the documentation meaning.   Toni Jones was evaluated in Emergency Department on 01/31/2019 for the symptoms described in the history of present illness. She was evaluated in the context of the global COVID-19 pandemic, which necessitated consideration that the patient might be at risk for infection with the SARS-CoV-2 virus that causes COVID-19. Institutional protocols and algorithms that pertain to the evaluation of patients at risk for COVID-19 are in a state of rapid change based on information released by regulatory bodies including the CDC and federal and state organizations. These policies and algorithms were followed during the patient's care in the ED.  Final Clinical Impressions(s) / ED Diagnoses   Final diagnoses:  Mild intermittent asthma, unspecified whether complicated    ED Discharge Orders         Ordered    predniSONE (DELTASONE) 20 MG tablet  Daily     01/31/19 1136           Virgina NorfolkCuratolo, Jacai Kipp, DO 01/31/19 1138

## 2019-01-31 NOTE — ED Triage Notes (Signed)
Pt states she gets bronchitis frequently. Pt states that she has had congestion since yesterday, and productive cough today. Pt states she needs COVID test to return to work.

## 2019-01-31 NOTE — Discharge Instructions (Signed)
Chest x-ray shows no signs of infection or inflammation.  Suspect you have an asthma exacerbation.  Use albuterol as discussed.  Take steroids as prescribed.  Self isolate until you hear back about your Covid test.

## 2019-02-01 LAB — NOVEL CORONAVIRUS, NAA (HOSP ORDER, SEND-OUT TO REF LAB; TAT 18-24 HRS): SARS-CoV-2, NAA: NOT DETECTED

## 2019-10-09 ENCOUNTER — Emergency Department (HOSPITAL_COMMUNITY): Payer: Federal, State, Local not specified - PPO

## 2019-10-09 ENCOUNTER — Encounter (HOSPITAL_COMMUNITY): Payer: Self-pay

## 2019-10-09 ENCOUNTER — Other Ambulatory Visit: Payer: Self-pay

## 2019-10-09 ENCOUNTER — Emergency Department (HOSPITAL_COMMUNITY)
Admission: EM | Admit: 2019-10-09 | Discharge: 2019-10-09 | Disposition: A | Discharge status: Home / Self Care | Payer: Federal, State, Local not specified - PPO | Attending: Emergency Medicine | Admitting: Emergency Medicine

## 2019-10-09 DIAGNOSIS — J45909 Unspecified asthma, uncomplicated: Secondary | ICD-10-CM | POA: Diagnosis not present

## 2019-10-09 DIAGNOSIS — Z79899 Other long term (current) drug therapy: Secondary | ICD-10-CM | POA: Insufficient documentation

## 2019-10-09 DIAGNOSIS — Z20822 Contact with and (suspected) exposure to covid-19: Secondary | ICD-10-CM | POA: Diagnosis not present

## 2019-10-09 DIAGNOSIS — M25559 Pain in unspecified hip: Secondary | ICD-10-CM

## 2019-10-09 DIAGNOSIS — M1612 Unilateral primary osteoarthritis, left hip: Secondary | ICD-10-CM | POA: Diagnosis not present

## 2019-10-09 DIAGNOSIS — R05 Cough: Secondary | ICD-10-CM | POA: Diagnosis not present

## 2019-10-09 DIAGNOSIS — R059 Cough, unspecified: Secondary | ICD-10-CM

## 2019-10-09 DIAGNOSIS — M25551 Pain in right hip: Secondary | ICD-10-CM | POA: Diagnosis not present

## 2019-10-09 DIAGNOSIS — M25552 Pain in left hip: Secondary | ICD-10-CM | POA: Insufficient documentation

## 2019-10-09 DIAGNOSIS — R52 Pain, unspecified: Secondary | ICD-10-CM

## 2019-10-09 HISTORY — DX: Bronchitis, not specified as acute or chronic: J40

## 2019-10-09 LAB — SARS CORONAVIRUS 2 BY RT PCR (HOSPITAL ORDER, PERFORMED IN ~~LOC~~ HOSPITAL LAB): SARS Coronavirus 2: NEGATIVE

## 2019-10-09 NOTE — Discharge Instructions (Signed)
Continue to take Robitussin as needed for your cough.  Also recommend taking a low dose of ibuprofen once to twice per day as well.  Please follow the instructions on the bottle.  Please take this medication with a small amount of food to prevent upset stomach.  Please continue to exercise as tolerated. I think swimming is a great idea.   You need to find a primary care provider in the area.  [facility] good options in the area.  We obtained a COVID-19 test today which was negative.  Please return to the emergency department if you develop new or worsening symptoms.  It was a pleasure to meet you.

## 2019-10-09 NOTE — ED Triage Notes (Addendum)
Patient c/o a non productive cough x 2 days. Patient c/o left leg pain after receiving her Covid shot in April. Patient states the pain has been worsening pain. Patient states that she has slight swelling to the left lower leg/ankle. Patient states she needs a Covid test before returning to work and was sesnt here to r/o a DVT.

## 2019-10-09 NOTE — ED Provider Notes (Signed)
[facility]-EMERGENCY DEPT Provider Note   CSN: [id number] Arrival date & time: 10/09/19  1139     History Chief Complaint  Patient presents with  . Cough  . Leg Pain    Toni Jones is a 59 y.o. female.  HPI Patient is a 64-year-old female with a medical history as noted below.  She presents today with multiple complaints.  Patient states that she received both doses of the COVID-19 vaccination 4 months ago.  She was "very sick" for 2 to 3 days after the vaccination and her symptoms mostly alleviated with continuing bilateral hip and knee pain which has persisted.  She additionally complains of atraumatic mild pain along the left quadriceps that worsens with deep palpation or when sleeping on her stomach.  Patient states last night she began experiencing a dry cough that worsens with deep breathing.  She is not a smoker.  She notes a history of asthma that is well controlled with albuterol and notes that she experiences bronchitis on a regular basis.  She states her current symptoms are similar to prior bronchitis exacerbations.  Patient feels as if her LLE is "colder than normal" and that she has decreased sensation to left lateral calf. No fevers, chills, chest pain, shortness of breath, abdominal pain, nausea, vomiting, diarrhea, syncope, leg swelling.    Past Medical History:  Diagnosis Date  . Asthma   . Bronchitis   . [place] bacteria [place]  . Foot fracture, left     There are no problems to display for this patient.   Past Surgical History:  Procedure Laterality Date  . APPENDECTOMY    . TONSILLECTOMY       OB History   No obstetric history on file.     Family History  Adopted: Yes    Social History   Tobacco Use  . Smoking status: Never Smoker  . Smokeless tobacco: Never Used  Vaping Use  . Vaping Use: Never used  Substance Use Topics  . Alcohol use: Never  . Drug use: Never    Home Medications Prior to  Admission medications   Medication Sig Start Date End Date Taking? Authorizing Provider  albuterol (VENTOLIN HFA) 108 (90 Base) MCG/ACT inhaler Inhale 1-2 puffs into the lungs every 6 (six) hours as needed for wheezing or shortness of breath. 08/01/18   Jacalyn Lefevre, MD  azithromycin (ZITHROMAX) 250 MG tablet Take 1 every day until finished. Patient not taking: Reported on 01/31/2019 08/01/18   Jacalyn Lefevre, MD  cephALEXin (KEFLEX) 500 MG capsule Take 1 capsule (500 mg total) by mouth 4 (four) times daily. Patient not taking: Reported on 08/01/2018 12/01/17   Ward, Chase Picket, PA-C  naproxen sodium (ALEVE) 220 MG tablet Take 220 mg by mouth daily as needed (pain).    [provider]  sodium chloride (OCEAN) 0.65 % SOLN nasal spray Place 1 spray into both nostrils as needed for congestion.    [provider]    Allergies    Onion and Red dye #40 [red dye]  Review of Systems   Review of Systems  All other systems reviewed and are negative. Ten systems reviewed and are negative for acute change, except as noted in the HPI.    Physical Exam Updated Vital Signs BP (!) 175/104   Pulse 73   Temp 98 F (36.7 C) (Oral)   Resp 17   Ht 5\' 5"  (1.651 m)   Wt 72.6 kg  SpO2 99%   BMI 26.63 kg/m   Physical Exam Vitals and nursing note reviewed.  Constitutional:      General: She is not in acute distress.    Appearance: Normal appearance. She is not ill-appearing, toxic-appearing or diaphoretic.  HENT:     Head: Normocephalic and atraumatic.     Right Ear: External ear normal.     Left Ear: External ear normal.     Nose: Nose normal.     Mouth/Throat:     Mouth: Mucous membranes are moist.     Pharynx: Oropharynx is clear. No oropharyngeal exudate or posterior oropharyngeal erythema.  Eyes:     Extraocular Movements: Extraocular movements intact.  Cardiovascular:     Rate and Rhythm: Normal rate and regular rhythm.     Pulses: Normal pulses.     Heart sounds:  Normal heart sounds. No murmur heard.  No friction rub. No gallop.   Pulmonary:     Effort: Pulmonary effort is normal. No respiratory distress.     Breath sounds: Normal breath sounds. No stridor. No wheezing, rhonchi or rales.  Chest:     Chest wall: No tenderness.  Abdominal:     General: Abdomen is flat.     Palpations: Abdomen is soft.     Tenderness: There is no abdominal tenderness.  Musculoskeletal:        General: Tenderness present. Normal range of motion.     Cervical back: Normal range of motion and neck supple. No tenderness.     Comments: Mild TTP noted along the left lateral hip.  Full range of motion of the bilateral hips and knees.  Patient is able to ambulate with a steady gait.  No calf pain appreciated.  No lower extremity edema noted.  2+ DP and PT pulses noted bilaterally.  Good cap refill in the toes of the bilateral feet.  Skin:    General: Skin is warm and dry.     Capillary Refill: Capillary refill takes less than 2 seconds.  Neurological:     General: No focal deficit present.     Mental Status: She is alert and oriented to person, place, and time.  Psychiatric:        Mood and Affect: Mood normal.        Behavior: Behavior normal.    ED Results / Procedures / Treatments   Labs (all labs ordered are listed, but only abnormal results are displayed) Labs Reviewed  SARS CORONAVIRUS 2 BY RT PCR (HOSPITAL ORDER, PERFORMED IN [facility] LAB)    EKG None  Radiology DG Chest 1 View  Result Date: 10/09/2019 CLINICAL DATA:  Cough times several weeks. EXAM: CHEST  1 VIEW COMPARISON:  January 31, 2019 FINDINGS: There is no evidence of acute infiltrate, pleural effusion or pneumothorax. The heart size and mediastinal contours are within normal limits. The visualized skeletal structures are unremarkable. IMPRESSION: No active disease. Electronically Signed   By: Aram Candela M.D.   On: 10/09/2019 15:21   DG HIP UNILAT WITH PELVIS 2-3 VIEWS  LEFT  Result Date: 10/09/2019 CLINICAL DATA:  Chronic hip pain. EXAM: DG HIP (WITH OR WITHOUT PELVIS) 2-3V LEFT COMPARISON:  None. FINDINGS: There are end-stage degenerative changes of the left hip with subchondral cystic changes and sclerosis. There is remodeling of the left femoral head. There are mild-to-moderate degenerative changes of the right hip. There is no acute displaced fracture or dislocation. IMPRESSION: 1. End-stage degenerative changes of the left hip. 2. Mild  to moderate degenerative changes of the right hip. Electronically Signed   By: Katherine Mantle M.D.   On: 10/09/2019 15:22    Procedures Procedures (including critical care time)  Medications Ordered in ED Medications - No data to display  ED Course  I have reviewed the triage vital signs and the nursing notes.  Pertinent labs & imaging results that were available during my care of the patient were reviewed by me and considered in my medical decision making (see chart for details).    MDM Rules/Calculators/A&P                          Pt is a 59 y.o. female that present with a history, physical exam, ED Clinical Course as noted above.   Patient presents today with pain in the left lower extremity as well as a cough that started last night.  Chest x-ray was benign.  Obtain an x-ray of the left hip which shows end-stage degenerative changes.  There are mild to moderate degenerative changes of the right hip.  I discussed in length with the patient.  Recommended use of NSAIDs as needed for pain.  We discussed dosing.  Patient recently moved to the area and has not obtained a PCP but states she is insured.  I recommended that she find a PCP as soon as possible to discuss this visit as well as her symptoms.  Also recommended that she find a PCP to discuss her blood pressure which was elevated on multiple occasions at this visit today.  Otherwise, her vital signs been stable.  She is not tachycardic.  She is not hypoxic.   She is afebrile.  Patient works for [employer] and needed a COVID-19 test obtained, which was negative.  Her questions were answered and she was amicable to time of discharge.  She was given strict return precautions and knows she needs to return the emergency department with new or worsening symptoms.  Patient discharged to home/self care.  Condition at discharge: Stable  Note: Portions of this report may have been transcribed using voice recognition software. Every effort was made to ensure accuracy; however, inadvertent computerized transcription errors may be present.    Final Clinical Impression(s) / ED Diagnoses Final diagnoses:  Cough  Hip pain   Rx / DC Orders ED Discharge Orders    None       Placido Sou, PA-C 10/09/19 1623    Pricilla Loveless, MD 10/11/19 [phone]

## 2019-12-26 ENCOUNTER — Other Ambulatory Visit: Payer: Self-pay

## 2019-12-26 ENCOUNTER — Emergency Department
Admission: EM | Admit: 2019-12-26 | Discharge: 2019-12-26 | Disposition: A | Discharge status: Home / Self Care | Payer: Federal, State, Local not specified - PPO | Source: Home / Self Care

## 2019-12-26 DIAGNOSIS — R8290 Unspecified abnormal findings in urine: Secondary | ICD-10-CM | POA: Diagnosis not present

## 2019-12-26 DIAGNOSIS — N3000 Acute cystitis without hematuria: Secondary | ICD-10-CM

## 2019-12-26 LAB — POCT URINALYSIS DIP (MANUAL ENTRY)
Bilirubin, UA: NEGATIVE
Blood, UA: NEGATIVE
Glucose, UA: NEGATIVE mg/dL
Ketones, POC UA: NEGATIVE mg/dL
Nitrite, UA: NEGATIVE
Protein Ur, POC: NEGATIVE mg/dL
Spec Grav, UA: 1.015 (ref 1.010–1.025)
Urobilinogen, UA: 0.2 E.U./dL
pH, UA: 6.5 (ref 5.0–8.0)

## 2019-12-26 MED ORDER — CEPHALEXIN 500 MG PO CAPS: 500.0000 mg | ORAL_CAPSULE | Freq: Two times a day (BID) | ORAL | 0 refills | Status: AC

## 2019-12-26 NOTE — ED Provider Notes (Signed)
[facility]    CSN: [id number] Arrival date & time: 12/26/19  1207      History   Chief Complaint Chief Complaint  Patient presents with  . Urinary Frequency    HPI Toni Jones is a 59 y.o. female.   HPI  Toni Jones is a 59 y.o. female presents for evaluation of urinary frequency, urgency and dysuria x 7 days, without flank pain, fever, chills, or bleeding. Reports no concern for STD.  Uncertain of prior urine pathology.   Past Medical History:  Diagnosis Date  . Asthma   . Bronchitis   . Flesh-eating bacteria (H[facility]    [place]  . Foot fracture, left     There are no problems to display for this patient.   Past Surgical History:  Procedure Laterality Date  . APPENDECTOMY    . TONSILLECTOMY      OB History   No obstetric history on file.      Home Medications    Prior to Admission medications   Medication Sig Start Date End Date Taking? Authorizing Provider  albuterol (VENTOLIN HFA) 108 (90 Base) MCG/ACT inhaler Inhale 1-2 puffs into the lungs every 6 (six) hours as needed for wheezing or shortness of breath. 08/01/18   Jacalyn Lefevre, MD  azithromycin (ZITHROMAX) 250 MG tablet Take 1 every day until finished. Patient not taking: Reported on 01/31/2019 08/01/18   Jacalyn Lefevre, MD  cephALEXin (KEFLEX) 500 MG capsule Take 1 capsule (500 mg total) by mouth 4 (four) times daily. Patient not taking: Reported on 08/01/2018 12/01/17   Ward, Chase Picket, PA-C  naproxen sodium (ALEVE) 220 MG tablet Take 220 mg by mouth daily as needed (pain).    [provider]  sodium chloride (OCEAN) 0.65 % SOLN nasal spray Place 1 spray into both nostrils as needed for congestion.    [provider]    Family History Family History  Adopted: Yes  Family history unknown: Yes    Social History Social History   Tobacco Use  . Smoking status: Never Smoker  . Smokeless tobacco: Never Used  Vaping Use  . Vaping Use: Never used   Substance Use Topics  . Alcohol use: Never  . Drug use: Never     Allergies   Onion and Red dye #40 [red dye]  Review of Systems Review of Systems Pertinent negatives listed in HPI  Physical Exam Triage Vital Signs ED Triage Vitals  Enc Vitals Group     BP 12/26/19 1228 (!) 165/95     Pulse Rate 12/26/19 1228 74     Resp 12/26/19 1228 16     Temp 12/26/19 1228 97.9 F (36.6 C)     Temp Source 12/26/19 1228 Oral     SpO2 12/26/19 1228 98 %     Weight --      Height --      Head Circumference --      Peak Flow --      Pain Score 12/26/19 1225 2     Pain Loc --      Pain Edu? --      Excl. in GC? --    No data found.  Updated Vital Signs BP (!) 165/95 (BP Location: Right Arm)   Pulse 74   Temp 97.9 F (36.6 C) (Oral)   Resp 16   SpO2 98%   Visual Acuity Right Eye Distance:   Left Eye Distance:   Bilateral Distance:    Right Eye  Near:   Left Eye Near:    Bilateral Near:     Physical Exam General appearance: alert, well developed, well nourished, cooperative Head: Normocephalic, without obvious abnormality, atraumatic Respiratory: Respirations even and unlabored, normal respiratory rate Heart: Rate and rhythm normal.   CVA:  no flank pain Extremities: No gross deformities Skin: Skin color, texture, turgor normal.  Psych: Appropriate mood and affect. UC Treatments / Results  Labs (all labs ordered are listed, but only abnormal results are displayed) Labs Reviewed  POCT URINALYSIS DIP (MANUAL ENTRY) - Abnormal; Notable for the following components:      Result Value   Leukocytes, UA Moderate (2+) (*)    All other components within normal limits  URINE CULTURE    EKG   Radiology No results found.  Procedures Procedures (including critical care time)  Medications Ordered in UC Medications - No data to display  Initial Impression / Assessment and Plan / UC Course  I have reviewed the triage vital signs and the nursing notes.  Pertinent  labs & imaging results that were available during my care of the patient were reviewed by me and considered in my medical decision making (see chart for details).    Marland Kitchen  UA abnormal and findings consistent with UTI. Empiric antibiotic treatment initiated. Encouraged increase intake of water. Urine culture pending. ER if symptoms become severe. Follow-up with PCP if symptoms do not completely resolve. Final Clinical Impressions(s) / UC Diagnoses   Final diagnoses:  Abnormal finding on urinalysis  Acute cystitis without hematuria   Discharge Instructions   None    ED Prescriptions    Medication Sig Dispense Auth. Provider   cephALEXin (KEFLEX) 500 MG capsule Take 1 capsule (500 mg total) by mouth 2 (two) times daily for 7 days. 14 capsule Bing Neighbors, FNP     PDMP not reviewed this encounter.   Bing Neighbors, [place] 12/28/19 [phone]

## 2019-12-26 NOTE — ED Triage Notes (Signed)
Patient presents to Urgent Care with complaints of urinary frequency and cloudiness since about a week ago. Patient reports she thinks she may have a uti. States she has also had some loose stool in the mornings. Pt has been trying to drink more water to help her sx but they have not improved.

## 2019-12-28 LAB — URINE CULTURE
MICRO NUMBER:: 11095827
SPECIMEN QUALITY:: ADEQUATE

## 2020-01-15 ENCOUNTER — Ambulatory Visit (INDEPENDENT_AMBULATORY_CARE_PROVIDER_SITE_OTHER): Payer: Federal, State, Local not specified - PPO | Admitting: Medical-Surgical

## 2020-01-15 ENCOUNTER — Encounter: Payer: Self-pay | Admitting: Medical-Surgical

## 2020-01-15 ENCOUNTER — Other Ambulatory Visit: Payer: Self-pay | Admitting: Medical-Surgical

## 2020-01-15 VITALS — BP 169/72 | HR 68 | Temp 97.7°F | Ht 64.5 in | Wt 216.0 lb

## 2020-01-15 DIAGNOSIS — M1990 Unspecified osteoarthritis, unspecified site: Secondary | ICD-10-CM

## 2020-01-15 DIAGNOSIS — J45909 Unspecified asthma, uncomplicated: Secondary | ICD-10-CM

## 2020-01-15 DIAGNOSIS — Z0289 Encounter for other administrative examinations: Secondary | ICD-10-CM

## 2020-01-15 DIAGNOSIS — R030 Elevated blood-pressure reading, without diagnosis of hypertension: Secondary | ICD-10-CM

## 2020-01-15 DIAGNOSIS — M5442 Lumbago with sciatica, left side: Secondary | ICD-10-CM

## 2020-01-15 DIAGNOSIS — M5441 Lumbago with sciatica, right side: Secondary | ICD-10-CM

## 2020-01-15 DIAGNOSIS — G8929 Other chronic pain: Secondary | ICD-10-CM

## 2020-01-15 DIAGNOSIS — R079 Chest pain, unspecified: Secondary | ICD-10-CM | POA: Diagnosis not present

## 2020-01-15 MED ORDER — PREDNISONE 50 MG PO TABS: 50.0000 mg | ORAL_TABLET | Freq: Every day | ORAL | 0 refills | Status: DC

## 2020-01-15 NOTE — Progress Notes (Addendum)
New Patient Office Visit  Subjective:  Patient ID: Toni Jones, female    DOB: [birth year]  Age: 59 y.o. MRN: [id number]  CC:  Chief Complaint  Patient presents with  . Establish Care  . Arthritis  . Asthma    HPI Toni Jones presents to establish care.   Arthritis- used to teach martial arts, tai chi, swimming, stretching and still stays active. Got her first COVID vaccine in April and since then has had severe low back pain and bilateral hip pain since then. Has had x-rays and was told that her arthritis has worsened significantly. Uses heat, Aleve, staying active, Ascorbine Jr liniment, massage, and lidocaine patches. Works for a little while but not much. Pain in both femurs, in the bone at the midpoint. Pain is sharp, stabbing.   Asthma- has been bad since wearing masks because fibers get in the lungs. Albuterol inhaler as needed less than once per week unless sick.   Heart concern- back in May, went to the ED via EMS for shortness of breath. Was given a rhythm strip and told she had a "heart event" not a heart attack. Was caring for her mother who was ill and has since passed away so she did not follow up on this. Since then she has had some episodes of shortness of breath. Usually does tai chi breathing and sits down to rest when this happens. Pain deep in her chest midsternal accompanied by shortness of breath and a "catch" in her breathing. Has to sit with her back arched to open her chest and then take a very slow deep breath. Sometimes her symptoms include dizziness, nausea, and vomiting. Episodes happen with activity or rest and are sometimes associated with high stress/anxiety. Happens approximately once a month. Sometimes preceded by right hand numbness/tingling.   Past Medical History:  Diagnosis Date  . Arthritis   . Asthma   . Bronchitis   . Chronic bronchitis ([place])   . Flesh-eating bacteria ([place])    from [place] ocean  . Foot fracture, left     Past Surgical  History:  Procedure Laterality Date  . APPENDECTOMY    . MANDIBLE SURGERY    . TONSILLECTOMY    . WISDOM TOOTH EXTRACTION      Family History  Adopted: Yes  Problem Relation Age of Onset  . Heart attack Mother   . Cancer Son        osteogenic carcinoma    Social History   Socioeconomic History  . Marital status: Divorced    Spouse name: Not on file  . Number of children: Not on file  . Years of education: Not on file  . Highest education level: Not on file  Occupational History  . Not on file  Tobacco Use  . Smoking status: Never Smoker  . Smokeless tobacco: Never Used  Vaping Use  . Vaping Use: Never used  Substance and Sexual Activity  . Alcohol use: Never  . Drug use: Never  . Sexual activity: Not Currently    Partners: Male  Other Topics Concern  . Not on file  Social History Narrative  . Not on file   Social Determinants of Health   Financial Resource Strain:   . Difficulty of Paying Living Expenses: Not on file  Food Insecurity:   . Worried About Charity fundraiser in the Last Year: Not on file  . Ran Out of Food in the Last Year: Not on file  Transportation Needs:   .  Lack of Transportation (Medical): Not on file  . Lack of Transportation (Non-Medical): Not on file  Physical Activity:   . Days of Exercise per Week: Not on file  . Minutes of Exercise per Session: Not on file  Stress:   . Feeling of Stress : Not on file  Social Connections:   . Frequency of Communication with Friends and Family: Not on file  . Frequency of Social Gatherings with Friends and Family: Not on file  . Attends Religious Services: Not on file  . Active Member of Clubs or Organizations: Not on file  . Attends Archivist Meetings: Not on file  . Marital Status: Not on file  Intimate Partner Violence:   . Fear of Current or Ex-Partner: Not on file  . Emotionally Abused: Not on file  . Physically Abused: Not on file  . Sexually Abused: Not on file     ROS Review of Systems  Constitutional: Positive for unexpected weight change (lost 10 lbs over the last month). Negative for chills, fatigue and fever.  Respiratory: Positive for chest tightness and shortness of breath. Negative for cough and wheezing.   Cardiovascular: Positive for chest pain. Negative for palpitations and leg swelling.  Gastrointestinal: Positive for nausea and vomiting. Negative for abdominal pain, constipation and diarrhea.  Musculoskeletal: Positive for arthralgias, gait problem and myalgias.  Neurological: Positive for dizziness and numbness. Negative for light-headedness and headaches.  Psychiatric/Behavioral: Positive for sleep disturbance. Negative for dysphoric mood, self-injury and suicidal ideas. The patient is nervous/anxious.     Objective:   Today's Vitals: BP (!) 169/72   Pulse 68   Temp 97.7 F (36.5 C) (Oral)   Ht 5' 4.5" (1.638 m)   Wt 216 lb (98 kg)   SpO2 99%   BMI 36.50 kg/m   Physical Exam Vitals reviewed.  Constitutional:      General: She is not in acute distress.    Appearance: Normal appearance.  HENT:     Head: Normocephalic and atraumatic.  Cardiovascular:     Rate and Rhythm: Normal rate and regular rhythm.     Pulses: Normal pulses.     Heart sounds: Normal heart sounds. No murmur heard.  No friction rub. No gallop.   Pulmonary:     Effort: Pulmonary effort is normal. No respiratory distress.     Breath sounds: Normal breath sounds. No wheezing.  Musculoskeletal:     Comments: Gait altered due to pain in lower back, bilateral hips, and bilateral thighs. Difficulty arising from a seated position.  Skin:    General: Skin is warm and dry.  Neurological:     Mental Status: She is alert and oriented to person, place, and time.  Psychiatric:        Mood and Affect: Mood normal.        Behavior: Behavior normal.        Thought Content: Thought content normal.        Judgment: Judgment normal.     Assessment & Plan:    1. Encounter for completion of form with patient FMLA forms supplied by patient for completion. She will need intermittent time away from work to allow for treatment of low back/hip/leg pain as well as evaluation of her chest pain/shortness of breath.   2. Chest pain, unspecified type Checking CBC, CMP, TSH, Troponin T. EKGs on file showing NSR in the recent months. Will plan for a Zio patch for long term monitoring. May benefit from an Echo.  -  CBC - COMPLETE METABOLIC PANEL WITH GFR - TSH - Troponin T - LONG TERM MONITOR (3-14 DAYS); Future  3. Elevated BP without diagnosis of HTN BP elevated in office today. Unclear if this is related to current pain, stress, anxiety, or other etiology. Will monitor at return visit and if still elevated, will discuss treatment options.   4. Arthritis/low back pain Suspect lumbar spine degeneration with sciatica. No red flags today. Will treat with burst of Prednisone 2m daily x 5 days. Patient is interested in PT, especially aquatics therapy. Referring to PT.   Outpatient Encounter Medications as of 01/15/2020  Medication Sig  . albuterol (VENTOLIN HFA) 108 (90 Base) MCG/ACT inhaler Inhale 1-2 puffs into the lungs every 6 (six) hours as needed for wheezing or shortness of breath.  . naproxen sodium (ALEVE) 220 MG tablet Take 220 mg by mouth daily as needed (pain).  . sodium chloride (OCEAN) 0.65 % SOLN nasal spray Place 1 spray into both nostrils as needed for congestion.  . predniSONE (DELTASONE) 50 MG tablet Take 1 tablet (50 mg total) by mouth daily.  . [DISCONTINUED] azithromycin (ZITHROMAX) 250 MG tablet Take 1 every day until finished. (Patient not taking: Reported on 01/31/2019)   No facility-administered encounter medications on file as of 01/15/2020.    Follow-up: Return in about 2 weeks (around 01/29/2020) for blood pressure and back pain follow up.   JClearnce Sorrel DNP, APRN, FNP-BC [facility]

## 2020-01-18 ENCOUNTER — Other Ambulatory Visit (INDEPENDENT_AMBULATORY_CARE_PROVIDER_SITE_OTHER): Payer: Federal, State, Local not specified - PPO

## 2020-01-18 DIAGNOSIS — R002 Palpitations: Secondary | ICD-10-CM

## 2020-01-18 DIAGNOSIS — R079 Chest pain, unspecified: Secondary | ICD-10-CM

## 2020-01-18 LAB — COMPLETE METABOLIC PANEL WITH GFR
AG Ratio: 1.6 (calc) (ref 1.0–2.5)
ALT: 21 U/L (ref 6–29)
AST: 21 U/L (ref 10–35)
Albumin: 4.2 g/dL (ref 3.6–5.1)
Alkaline phosphatase (APISO): 79 U/L (ref 37–153)
BUN: 14 mg/dL (ref 7–25)
CO2: 28 mmol/L (ref 20–32)
Calcium: 9.9 mg/dL (ref 8.6–10.4)
Chloride: 107 mmol/L (ref 98–110)
Creat: 0.75 mg/dL (ref 0.50–0.99)
GFR, Est African American: 100 mL/min/{1.73_m2} (ref >=60)
GFR, Est Non African American: 87 mL/min/{1.73_m2} (ref >=60)
Globulin: 2.6 g/dL (calc) (ref 1.9–3.7)
Glucose, Bld: 95 mg/dL (ref 65–139)
Potassium: 4.3 mmol/L (ref 3.5–5.3)
Sodium: 146 mmol/L (ref 135–146)
Total Bilirubin: 0.3 mg/dL (ref 0.2–1.2)
Total Protein: 6.8 g/dL (ref 6.1–8.1)

## 2020-01-18 LAB — B12 AND FOLATE PANEL
Folate: 14.6 ng/mL
Vitamin B-12: 955 pg/mL (ref 200–1100)

## 2020-01-18 LAB — CBC
HCT: 35.7 % (ref 35.0–45.0)
Hemoglobin: 10.9 g/dL — ABNORMAL LOW (ref 11.7–15.5)
MCH: 21.4 pg — ABNORMAL LOW (ref 27.0–33.0)
MCHC: 30.5 g/dL — ABNORMAL LOW (ref 32.0–36.0)
MCV: 70 fL — ABNORMAL LOW (ref 80.0–100.0)
MPV: 10.7 fL (ref 7.5–12.5)
Platelets: 505 10*3/uL — ABNORMAL HIGH (ref 140–400)
RBC: 5.1 10*6/uL (ref 3.80–5.10)
RDW: 15.2 % — ABNORMAL HIGH (ref 11.0–15.0)
WBC: 10.9 10*3/uL — ABNORMAL HIGH (ref 3.8–10.8)

## 2020-01-18 LAB — IRON, TOTAL/TOTAL IRON BINDING CAP
%SAT: 4 % (calc) — ABNORMAL LOW (ref 16–45)
Iron: 16 ug/dL — ABNORMAL LOW (ref 45–160)
TIBC: 411 mcg/dL (calc) (ref 250–450)

## 2020-01-18 LAB — FERRITIN: Ferritin: 12 ng/mL — ABNORMAL LOW (ref 16–232)

## 2020-01-18 LAB — TSH: TSH: 1.83 mIU/L (ref 0.40–4.50)

## 2020-01-18 LAB — TROPONIN T: Troponin T TROPT: <0.01 ng/mL (ref <0.01)

## 2020-01-23 ENCOUNTER — Ambulatory Visit (INDEPENDENT_AMBULATORY_CARE_PROVIDER_SITE_OTHER): Payer: Federal, State, Local not specified - PPO | Admitting: Rehabilitative and Restorative Service Providers"

## 2020-01-23 ENCOUNTER — Encounter: Payer: Self-pay | Admitting: Rehabilitative and Restorative Service Providers"

## 2020-01-23 ENCOUNTER — Other Ambulatory Visit: Payer: Self-pay

## 2020-01-23 DIAGNOSIS — R29898 Other symptoms and signs involving the musculoskeletal system: Secondary | ICD-10-CM | POA: Diagnosis not present

## 2020-01-23 DIAGNOSIS — M25551 Pain in right hip: Secondary | ICD-10-CM | POA: Diagnosis not present

## 2020-01-23 DIAGNOSIS — R2689 Other abnormalities of gait and mobility: Secondary | ICD-10-CM | POA: Diagnosis not present

## 2020-01-23 DIAGNOSIS — M25552 Pain in left hip: Secondary | ICD-10-CM | POA: Diagnosis not present

## 2020-01-23 NOTE — Therapy (Signed)
[facility]  [place], [place], [address] Phone: [phone]   Fax:  [phone]  Physical Therapy Evaluation  Patient Details  Name: Toni Jones MRN: [id number] Date of Birth: [birth year] Referring Provider (PT): Samuel Bouche NP   Encounter Date: 01/23/2020   PT End of Session - 01/23/20 1723    Visit Number 1    Number of Visits 12    Date for PT Re-Evaluation 03/12/20    PT Start Time 6811    PT Stop Time 1433    PT Time Calculation (min) 48 min    Activity Tolerance Patient limited by pain           Past Medical History:  Diagnosis Date  . Arthritis   . Asthma   . Bronchitis   . Chronic bronchitis ([place])   . Flesh-eating bacteria ([place])    from the ocean  . Foot fracture, left     Past Surgical History:  Procedure Laterality Date  . APPENDECTOMY    . MANDIBLE SURGERY    . TONSILLECTOMY    . WISDOM TOOTH EXTRACTION      There were no vitals filed for this visit.    Subjective Assessment - 01/23/20 1351    Subjective Patient reports that she started having pain in both hip joints with progression of joint pain after first [place] immunization 4/21 which lasted several weeks with pain settled in bilat hips. She received second immunization 5/21 with severe pain bilat LE's including sharp pain in the anterior thighs Lt > Rt. Pain creates frequent falls. She has heart pain but not an MI and is now on a heart monitor for 2 weeks.    Pertinent History arthritis bilat hips; fx Lt foot post ORIF ~ 15 yrs ago    Patient Stated Goals be able to move more normally    Currently in Pain? Yes    Pain Score 6     Pain Location Hip    Pain Orientation Left;Right    Pain Descriptors / Indicators Throbbing;Aching;Shooting    Pain Type Chronic pain    Pain Radiating Towards anterior thigh    Pain Onset More than a month ago    Pain Frequency Intermittent    Aggravating Factors  standing; walking; steps;  lifting    Pain Relieving Factors lying down              [facility] PT Assessment - 01/23/20 0001      Assessment   Medical Diagnosis Chronic LBP; bilat sciatica    Referring Provider (PT) Samuel Bouche NP    Onset Date/Surgical Date 06/21/19    Hand Dominance Right;Left    Next MD Visit 01/29/20    Prior Therapy yes for injuries with martial arts       Precautions   Precautions None      Restrictions   Weight Bearing Restrictions No      Balance Screen   Has the patient fallen in the past 6 months Yes    How many times? 10    Has the patient had a decrease in activity level because of a fear of falling?  No    Is the patient reluctant to leave their home because of a fear of falling?  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, sitting, bending, lifting     Leisure previously did martial arts; tai chi; swimming; stretching; biking - now sedentary       Observation/Other Assessments   Focus on Therapeutic Outcomes (FOTO)  60% limitation       Sensation   Additional Comments lateral Lt leg constant in past 6 months       Posture/Postural Control   Posture Comments wide based stance; flexed forward at hips: LE in ER       AROM   Right/Left Hip --   end range tightness bilat hips - pain w/ all motions    Right/Left Knee --   WNL's bilat    Right/Left Ankle --   WNL's bilat    Lumbar Flexion 100%    Lumbar Extension 60%    Lumbar - Right Side Bend 75%    Lumbar - Left Side Bend 75%    Lumbar - Right Rotation 60%     Lumbar - Left Rotation 60%      Strength   Overall Strength Comments unable to assess strength due to pain and pt giving with attempt to test strength resistively       Flexibility   Hamstrings tight Lt > Rt     Quadriceps tight bilat     ITB tight Lt > Rt    Piriformis tight  Lt > Rt       Palpation   Spinal mobility WNL's lumbar mobs     Palpation comment muscular tightness and pain Lt > Rt psoas; hip flexors; gluts; pirifromis; hamstrings; quads       Special Tests   Other special tests (-) SLR; slump tests bilat       Transfers   Comments difficulty with all transfers       Ambulation/Gait   Ambulation/Gait Yes    Ambulation/Gait Assistance 6: Modified independent (Device/Increase time)    Ambulation Distance (Feet) 80 Feet    Assistive device None    Ambulation Surface Level    Gait velocity slowed    Gait Comments antalgic gait with abducted LE posture; limp Lt > Rt with wt bearing; flexed forward at hips      Balance   Balance Assessed --   SLS ~ 10 sec each LE difficult on Lt                      Objective measurements completed on examination: See above findings.       [facility] Adult PT Treatment/Exercise - 01/23/20 0001      Self-Care   Self-Care Other Self-Care Comments      Knee/Hip Exercises: Stretches   Passive Hamstring Stretch Right;Left;2 reps;30 seconds   supine with strap    Hip Flexor Stretch Right;Left;1 rep;30 seconds   seated    Piriformis Stretch Right;Left;1 rep;30 seconds   supine                  PT Education - 01/23/20 1428    Education Details HEP POC    Person(s) Educated Patient    Methods Explanation;Demonstration;Tactile cues;Verbal cues;Handout    Comprehension Verbalized understanding;Returned demonstration;Verbal cues required;Tactile cues required               PT Long Term Goals - 01/23/20 1729      PT LONG TERM GOAL #1   Title Improve standing and walking tolerance to 15-20 min with minimal to no increase  in pain    Time 6    Period Weeks    Status New    Target Date 03/12/20      PT LONG TERM GOAL #2   Title Increase mobility and ROM through bilat hips allowing patient to transfer with greater ease and less pain    Time 6    Period Weeks    Status New    Target  Date 03/12/20      PT LONG TERM GOAL #3   Title Decrease pain in bilt LEs by 25-50% allowing patient to work and perform ADL's with greater ease    Time 6    Period Weeks    Status New    Target Date 03/12/20      PT LONG TERM GOAL #4   Title Independent in land based and aquatic exercise programs    Time 6    Period Weeks    Status New    Target Date 03/12/20      PT LONG TERM GOAL #5   Title Improvem FOTO to ,/= 43% limitation    Time 6    Period Weeks    Status New    Target Date 03/12/20                  Plan - 01/23/20 1724    Clinical Impression Statement Patient presents with ~ 7 month history of bilat hip pain originally with multiple joint pain reported but symptoms have localized to hips. She has poor posture and alignment; difficulty with all traisfers; antalgic gait; decreased hip mobility and ROM; pain limiting functioin and resulting in frequent falls. Patient will benefit from PT to address problems identified.    Stability/Clinical Decision Making Stable/Uncomplicated    Clinical Decision Making Low    Rehab Potential Good    PT Frequency 2x / week    PT Duration 6 weeks    PT Treatment/Interventions ADLs/Self Care Home Management;Aquatic Therapy;Cryotherapy;Electrical Stimulation;Iontophoresis 87m/ml Dexamethasone;Moist Heat;Ultrasound;Gait training;Stair training;Functional mobility training;Therapeutic activities;Therapeutic exercise;Balance training;Neuromuscular re-education;Patient/family education;Manual techniques;Passive range of motion;Dry needling;Taping    PT Next Visit Plan Aquatic therapy; review and progress HEP; manual work and modalities as indicated    PT Home Exercise Plan [email or web address]    Consulted and Agree with Plan of Care Patient           Patient will benefit from skilled therapeutic intervention in order to improve the following deficits and impairments:  Abnormal gait, Decreased range of motion, Difficulty walking, Decreased  activity tolerance, Pain, Decreased balance, Impaired flexibility, Improper body mechanics, Decreased mobility, Decreased strength, Postural dysfunction  Visit Diagnosis: Pain in left hip - Plan: PT plan of care cert/re-cert  Pain in right hip - Plan: PT plan of care cert/re-cert  Other abnormalities of gait and mobility - Plan: PT plan of care cert/re-cert  Other symptoms and signs involving the musculoskeletal system - Plan: PT plan of care cert/re-cert     Problem List There are no problems to display for this patient.   CHorton MPH  01/23/2020, 5:37 PM  [facility] [address]Phone: [phone]  Fax:  [phone] Name: LJaylanie BoscheeMRN: [id number] of Birth: [birth year]

## 2020-01-23 NOTE — Patient Instructions (Addendum)
   Access Code: [email or web address]: [email or web address]: 11/17/2021Prepared by: Nardos Putnam HoltExercises  Hooklying Hamstring Stretch with Strap - 2 x daily - 7 x weekly - 1 sets - 3 reps - 30 sec hold  Supine Piriformis Stretch with Leg Straight - 2 x daily - 7 x weekly - 1 sets - 3 reps - 30 sec hold    Aquatic Therapy: What to Expect!  Where:  [facility]  [address] [place], [place]  [address] [phone]    NOTE: Bonita Quin will receive an automated phone message  reminding you of your appointment and it will say the   appointment is at [facility] in [place]. We are  working to fix this - just know that you will meet [place] at pool!       How to Prepare: . Please make sure you drink 8 ounces of water about one hour prior to your pool session. . Sign in at the front desk upon your arrival.  Once on the pool deck, your therapist will ask you to sign the Patient  Consent and Assignment of Benefits form. . If you have a caregiver, they must attend the entire session with you (unless your primary therapists feels this is not necessary). The caregiver will be responsible for assisting with dressing as well as any toileting needs.  . Please arrive IN YOUR SUIT and a few minutes prior to your appointment - this helps to avoid delays in starting your session. . Please make sure to attend to any toileting needs prior to entering the pool. . You can bring your pool bag with you and place it on bench near our work space.   . Your therapist may take your blood pressure prior to, during, and after your session if indicated. . We usually try and create a home exercise program based on activities we do in the pool.  Please be thinking about who might be able to assist you in the pool should you want to participate in an aquatic home exercise program at the time of discharge.  Some patients do not want to or do not have the ability to participate in an aquatic home program  - this is not a barrier in any way to you participating in aquatic therapy as part of your current therapy plan! About the pool: 1. Entering the pool: Your therapist will assist you; there are multiple ways to enter including stairs with railings, a walk-in ramp, a roll-in chair and a mechanical lift. Your therapist will determine the most appropriate way for you. 2. Water temperature is usually between 86-87 degrees. 3. There may be other swimmers in the pool at the same time. 4. All sessions are 45 minutes.  Contact Info:  [facility] -  [address] [place], [place] [address]     Please call our [facility] office if you need to cancel or reschedule.   [phone]

## 2020-01-24 ENCOUNTER — Other Ambulatory Visit: Payer: Self-pay

## 2020-01-24 ENCOUNTER — Other Ambulatory Visit: Payer: Self-pay | Admitting: Medical-Surgical

## 2020-01-24 DIAGNOSIS — M25551 Pain in right hip: Secondary | ICD-10-CM

## 2020-01-24 DIAGNOSIS — M25552 Pain in left hip: Secondary | ICD-10-CM

## 2020-01-24 DIAGNOSIS — G8929 Other chronic pain: Secondary | ICD-10-CM

## 2020-01-24 MED ORDER — METHOCARBAMOL 500 MG PO TABS: 500.0000 mg | ORAL_TABLET | Freq: Three times a day (TID) | ORAL | 0 refills | Status: DC

## 2020-01-24 MED ORDER — FERROUS GLUCONATE 324 (38 FE) MG PO TABS: 324.0000 mg | ORAL_TABLET | Freq: Every day | ORAL | 1 refills | Status: DC

## 2020-01-24 NOTE — Addendum Note (Signed)
Addended byChristen Butter on: 01/24/2020 08:59 AM   Modules accepted: Orders

## 2020-01-24 NOTE — Telephone Encounter (Signed)
-----   Message from Christen Butter, NP sent at 01/24/2020  8:01 AM EST ----- Please contact patient:   At the recommendation of physical therapy, I have sent a prescription for a muscle relaxer named methocarbamol (Robaxin). This can be taken every 8 hours as needed for muscle spasms. Let's see if this helps with some of the leg and hip pain.

## 2020-01-24 NOTE — Telephone Encounter (Signed)
Pt aware of Joy's message below and that the Rx has already been sent to the pharmacy.   As a side note, she wanted to let Ander Slade know that the Rx for prednisone 50 mg that she was given to take for 5 days did not do anything to help. She said the only thing it did was make her crave [employer] food. She said that PT told her she needed to be sure and let her PCP know about this.

## 2020-01-24 NOTE — Telephone Encounter (Signed)
After being able to review her previous imaging, I am not surprised the prednisone did not help.  Imaging of her left hip shows that she is likely going to need a total hip replacement.  It may be beneficial now to get her in touch with an orthopedic provider who can evaluate and discuss options for treatment or repair.  If she would like me to go ahead with this, I will be glad to place that referral.

## 2020-01-25 ENCOUNTER — Ambulatory Visit (INDEPENDENT_AMBULATORY_CARE_PROVIDER_SITE_OTHER): Payer: Federal, State, Local not specified - PPO | Admitting: Physical Therapy

## 2020-01-25 ENCOUNTER — Other Ambulatory Visit: Payer: Self-pay

## 2020-01-25 DIAGNOSIS — M25551 Pain in right hip: Secondary | ICD-10-CM

## 2020-01-25 DIAGNOSIS — R29898 Other symptoms and signs involving the musculoskeletal system: Secondary | ICD-10-CM

## 2020-01-25 DIAGNOSIS — R2689 Other abnormalities of gait and mobility: Secondary | ICD-10-CM

## 2020-01-25 DIAGNOSIS — M25552 Pain in left hip: Secondary | ICD-10-CM | POA: Diagnosis not present

## 2020-01-25 NOTE — Telephone Encounter (Signed)
Referral order signed

## 2020-01-25 NOTE — Telephone Encounter (Signed)
Pt aware referral has been approved. Instructed her to call us back if she has not heard from anyone regarding scheduling by 02/08/2020.  No further questions or concerns at this time.

## 2020-01-25 NOTE — Telephone Encounter (Signed)
LVMTRC (1st attempt)   

## 2020-01-25 NOTE — Therapy (Addendum)
[place], [place], [address] Phone: [phone]   Fax:  [phone]  Physical Therapy Treatment and Discharge  Patient Details  Name: Toni Jones MRN: [id number] Date of Birth: 1960/05/08 Referring Provider (PT): Samuel Bouche NP   Encounter Date: 01/25/2020   PT End of Session - 01/25/20 1417    Visit Number 2    Number of Visits 12    Date for PT Re-Evaluation 03/12/20    PT Start Time 1315    PT Stop Time 1400    PT Time Calculation (min) 45 min    Activity Tolerance Patient tolerated treatment well    Behavior During Therapy [facility] for tasks assessed/performed           Past Medical History:  Diagnosis Date  . Arthritis   . Asthma   . Bronchitis   . Chronic bronchitis ([place])   . Flesh-eating bacteria ([place])    from the ocean  . Foot fracture, left     Past Surgical History:  Procedure Laterality Date  . APPENDECTOMY    . MANDIBLE SURGERY    . TONSILLECTOMY    . WISDOM TOOTH EXTRACTION      There were no vitals filed for this visit.   Subjective Assessment - 01/25/20 1418    Subjective Pt reports she has been taking calcium, magnesium, and other joint supplements and has noticed a difference.   With taking muscle relaxers, she is able to sleep better.  Her doctor contacted her and she was told she needs a hip replacement right away; will have Lt one done first. "My bone is disintegrating"    Patient Stated Goals be able to move more normally    Currently in Pain? Yes    Pain Score 5     Pain Location Hip    Pain Orientation Left;Right    Pain Descriptors / Indicators Aching;Shooting    Pain Radiating Towards ant thigh           Pt seen for aquatic therapy today.  Treatment took place in water 3-4 ft in depth at [facility] Temp of water was 87 deg.  Pt was in the pool prior to the therapist's arrival.  She exited the pool via steps with unilateral support on  rail.  Treatment:   Side stepping Lt/Rt x 20 meters.  Forward gait without UE support x 5 meters with pt cues for narrower base of support, upright posture and to not hold hips so rigid. Switched to UE support on floatation walker with improved gait quality:  Forward and backward gait 30 meters each, with continued cues for heel strike, upright posture and to narrow her base, and shorten her step length. Short trial of tandem forward gait x 10 ft (too challenging)  Standing with UE support on edge of pool:   bilat heel raises x 10, hip abdct x 5 reps each leg, hip ext x 5 reps each leg (cues for upright posture). Hip flexion to ~70 deg and hip IR/ER to tolerance.   Trial of quad stretch with pool noodle at Lt ankle - no stretch felt.   Deep squats pushing floation dumbbell below water with core engaged x 10.  Sitting on pool noodle, small hops for short range quad/hamstring activation, forward/backward x 10 meters each; stool scoots (sitting on noodle) x 10 meters forward/ backward.   Ai Chi: gathering x 10 reps.   Pt requires buoyancy for support and to  offload joints with strengthening exercises. Viscosity of the water is needed for resistance of strengthening; water current perturbations provides challenge to standing balance unsupported, requiring increased core activation.      PT Long Term Goals - 01/23/20 1729      PT LONG TERM GOAL #1   Title Improve standing and walking tolerance to 15-20 min with minimal to no increase in pain    Time 6    Period Weeks    Status New    Target Date 03/12/20      PT LONG TERM GOAL #2   Title Increase mobility and ROM through bilat hips allowing patient to transfer with greater ease and less pain    Time 6    Period Weeks    Status New    Target Date 03/12/20      PT LONG TERM GOAL #3   Title Decrease pain in bilt LEs by 25-50% allowing patient to work and perform ADL's with greater ease    Time 6    Period Weeks    Status New    Target  Date 03/12/20      PT LONG TERM GOAL #4   Title Independent in land based and aquatic exercise programs    Time 6    Period Weeks    Status New    Target Date 03/12/20      PT LONG TERM GOAL #5   Title Improvem FOTO to ,/= 43% limitation    Time 6    Period Weeks    Status New    Target Date 03/12/20                  Patient will benefit from skilled therapeutic intervention in order to improve the following deficits and impairments:     Visit Diagnosis: Pain in left hip  Pain in right hip  Other abnormalities of gait and mobility  Other symptoms and signs involving the musculoskeletal system     Problem List There are no problems to display for this patient. PHYSICAL THERAPY DISCHARGE SUMMARY  Visits from Start of Care: 2  Current functional level related to goals / functional outcomes: Pt participated in 1 aquatic therapy session   Remaining deficits: See above   Education / Equipment: HEP Plan: Patient agrees to discharge.  Patient goals were not met. Patient is being discharged due to a change in medical status.  ?????      Isabelle Course, PT   Kerin Perna, PTA 01/25/20 2:41 PM  [place], [place], [address] Phone: [phone]   Fax:  [phone]  Name: Lihanna Biever MRN: [id number] Date of Birth: Apr 01, 1960

## 2020-01-25 NOTE — Telephone Encounter (Signed)
Pt aware of Joy's response below and would like to have the referral to Ortho placed. Referral order tee'd up below and is ready for review and approval/denial.

## 2020-01-29 ENCOUNTER — Encounter: Payer: Self-pay | Admitting: Medical-Surgical

## 2020-01-29 ENCOUNTER — Ambulatory Visit (INDEPENDENT_AMBULATORY_CARE_PROVIDER_SITE_OTHER): Payer: Federal, State, Local not specified - PPO | Admitting: Medical-Surgical

## 2020-01-29 ENCOUNTER — Ambulatory Visit (INDEPENDENT_AMBULATORY_CARE_PROVIDER_SITE_OTHER): Payer: Federal, State, Local not specified - PPO

## 2020-01-29 ENCOUNTER — Other Ambulatory Visit: Payer: Self-pay

## 2020-01-29 VITALS — BP 146/78 | HR 89 | Temp 97.9°F | Ht 64.5 in | Wt 218.0 lb

## 2020-01-29 DIAGNOSIS — I10 Essential (primary) hypertension: Secondary | ICD-10-CM | POA: Diagnosis not present

## 2020-01-29 DIAGNOSIS — Z0289 Encounter for other administrative examinations: Secondary | ICD-10-CM

## 2020-01-29 DIAGNOSIS — M79651 Pain in right thigh: Secondary | ICD-10-CM

## 2020-01-29 DIAGNOSIS — M79652 Pain in left thigh: Secondary | ICD-10-CM | POA: Diagnosis not present

## 2020-01-29 DIAGNOSIS — M1612 Unilateral primary osteoarthritis, left hip: Secondary | ICD-10-CM | POA: Diagnosis not present

## 2020-01-29 DIAGNOSIS — M1611 Unilateral primary osteoarthritis, right hip: Secondary | ICD-10-CM | POA: Diagnosis not present

## 2020-01-29 MED ORDER — LOSARTAN POTASSIUM 25 MG PO TABS: 25.0000 mg | ORAL_TABLET | Freq: Every day | ORAL | 1 refills | Status: DC

## 2020-01-29 MED ORDER — BACLOFEN 10 MG PO TABS: 10.0000 mg | ORAL_TABLET | Freq: Three times a day (TID) | ORAL | 0 refills | Status: DC | PRN

## 2020-01-29 NOTE — Progress Notes (Signed)
Subjective:    CC: BP follow up, leg pain, FMLA forms  HPI: Pleasant 59 year old female presenting for follow up on elevated blood pressure, bilateral thigh pain, and FMLA forms.  FMLA- her job has requested the paperwork be filled out with more detail. She has the forms for further completion today.  BP- elevated for the last 1-2 years on various recordings but reports she has never taken medications for management. She is currently wearing her Zio-patch. Notes that she has had 2 episodes where she experienced lightheadedness, palpitations, and nausea/vomiting since she started wearing the monitor. Both of these episodes were marked by her pushing the patient event button.   Bilateral thigh pain/leg weakness- has been doing physical therapy, including aquatic therapy. Robaxin is not helpful and she feels she needs something to help her sleep since she has trouble getting comfortable. Continues to experience deep pain in her bilateral anterior thighs that is not reproducible on exam or with manipulation of the muscles of the thigh. Significantly reduced strength of bilateral hip flexors.   I reviewed the past medical history, family history, social history, surgical history, and allergies today and no changes were needed.  Please see the problem list section below in epic for further details.  Past Medical History: Past Medical History:  Diagnosis Date  . Arthritis   . Asthma   . Bronchitis   . Chronic bronchitis ([facility])   . Flesh-eating bacteria ([facility])    from the ocean  . Foot fracture, left    Past Surgical History: Past Surgical History:  Procedure Laterality Date  . APPENDECTOMY    . MANDIBLE SURGERY    . TONSILLECTOMY    . WISDOM TOOTH EXTRACTION     Social History: Social History   Socioeconomic History  . Marital status: Divorced    Spouse name: Not on file  . Number of children: Not on file  . Years of education: Not on file  . Highest education level: Not on file   Occupational History  . Not on file  Tobacco Use  . Smoking status: Never Smoker  . Smokeless tobacco: Never Used  Vaping Use  . Vaping Use: Never used  Substance and Sexual Activity  . Alcohol use: Never  . Drug use: Never  . Sexual activity: Not Currently    Partners: Male  Other Topics Concern  . Not on file  Social History Narrative  . Not on file   Social Determinants of Health   Financial Resource Strain:   . Difficulty of Paying Living Expenses: Not on file  Food Insecurity:   . Worried About Programme researcher, broadcasting/film/video in the Last Year: Not on file  . Ran Out of Food in the Last Year: Not on file  Transportation Needs:   . Lack of Transportation (Medical): Not on file  . Lack of Transportation (Non-Medical): Not on file  Physical Activity:   . Days of Exercise per Week: Not on file  . Minutes of Exercise per Session: Not on file  Stress:   . Feeling of Stress : Not on file  Social Connections:   . Frequency of Communication with Friends and Family: Not on file  . Frequency of Social Gatherings with Friends and Family: Not on file  . Attends Religious Services: Not on file  . Active Member of Clubs or Organizations: Not on file  . Attends Banker Meetings: Not on file  . Marital Status: Not on file   Family History: Family History  Adopted: Yes  Problem Relation Age of Onset  . Heart attack Mother   . Cancer Son        osteogenic carcinoma   Allergies: Allergies  Allergen Reactions  . Onion Other (See Comments)    Nose bleeds  . Red Dye #40 [Red Dye] Hives   Medications: See med rec.  Review of Systems: See HPI for pertinent positives and negatives.   Objective:    General: Well Developed, well nourished, and in no acute distress.  Neuro: Alert and oriented x3.  HEENT: Normocephalic, atraumatic.  Skin: Warm and dry. Cardiac: Regular rate and rhythm, no murmurs rubs or gallops, no lower extremity edema.  Respiratory: Clear to auscultation  bilaterally. Not using accessory muscles, speaking in full sentences.  Impression and Recommendations:    1. Essential hypertension With historical elevation in blood pressure and today's reading, this appears to be essential hypertension. Starting losartan 25mg  daily. Recommend monitoring BP at home and limiting dietary sodium.   2. Bilateral thigh pain Muscle weakness with bilateral thigh pain, especially deep is very concerning. Suspect we will need an MRI but for today getting x-rays of bilateral femurs to evaluate for lytic lesions or other abnormality. Discontinue Robaxin. Start Baclofen 10mg  TID prn. Recommend taking it before bed to see if this helps with sleep. Advised that this medication can cause drowsiness and to avoid taking it when she has to work or drive.  - DG FEMUR MIN 2 VIEWS LEFT; Future - DG FEMUR, MIN 2 VIEWS RIGHT; Future  3. Encounter for completion of form with patient Will provide more information on FMLA forms. Patient verbalized permission to share medical information and diagnoses as needed.   Return in about 2 weeks (around 02/12/2020) for nurse visit for BP check . ___________________________________________ , DNP, APRN, FNP-BC Primary Care and Sports Medicine [facility]

## 2020-01-30 ENCOUNTER — Encounter: Payer: Federal, State, Local not specified - PPO | Admitting: Physical Therapy

## 2020-01-30 ENCOUNTER — Encounter: Payer: Self-pay | Admitting: Medical-Surgical

## 2020-02-04 ENCOUNTER — Telehealth: Payer: Self-pay

## 2020-02-04 NOTE — Telephone Encounter (Signed)
Called pt and LVM letting her know that her most recent FMLA forms have been completed along with the detailed letter from [employer]. Informed her that they are in an envelope at the front desk and she can come by anytime Monday-Friday, 8:00am-4:30pm, except lunch which is from 12-1, and pick them up at the front desk. Asked her to call me back if she has any questions or concerns.

## 2020-02-05 ENCOUNTER — Encounter: Payer: Self-pay | Admitting: Orthopaedic Surgery

## 2020-02-05 ENCOUNTER — Ambulatory Visit (INDEPENDENT_AMBULATORY_CARE_PROVIDER_SITE_OTHER): Payer: Federal, State, Local not specified - PPO | Admitting: Orthopaedic Surgery

## 2020-02-05 ENCOUNTER — Other Ambulatory Visit: Payer: Self-pay

## 2020-02-05 VITALS — Ht 65.0 in | Wt 215.8 lb

## 2020-02-05 DIAGNOSIS — M1612 Unilateral primary osteoarthritis, left hip: Secondary | ICD-10-CM

## 2020-02-05 NOTE — Progress Notes (Signed)
Office Visit Note   Patient: Toni Jones           Date of Birth: 1960-06-07           MRN: [id number] Visit Date: 02/05/2020              Requested by: Christen Butter, NP [address] [place],  [place] [address] PCP: Christen Butter, NP   Assessment & Plan: Visit Diagnoses:  1. Unilateral primary osteoarthritis, left hip     Plan: Impression is advanced degenerative joint disease left hip. We discussed treatment options to include cortisone injection versus left total hip replacement. She lives with a couple of roommates that can help her in the recovery period.  She would like to proceed with left total hip replacement at this point in time. Risk, benefits and possible occasions reviewed. Rehab recovery time discussed. All questions were answered.  Follow-Up Instructions: Return for 2 week postop .   Orders:  No orders of the defined types were placed in this encounter.  No orders of the defined types were placed in this encounter.     Procedures: No procedures performed   Clinical Data: No additional findings.   Subjective: Chief Complaint  Patient presents with  . Right Hip - Pain  . Left Hip - Pain    HPI patient is a very pleasant 59 year old female who comes in today for evaluation and treatment recommendations of left hip pain. This began back in April of this year following her first covid vaccine. She has had significantly worsening pain since. The pain she has is to the lateral hip and occasionally radiates into the groin. This is affecting her ability to ambulate and work. She has significantly increased pain with flexion of the hip as well as with ambulation. She has pain that wakes her from her sleep as well. She has been taking hot showers and using muscle relaxers without significant relief of symptoms. She does note slight numbness to the lateral aspect of the left lower leg. No bowel or bladder dysfunction.  Her PCP referred patient to PT  for strengthening and ROM and she has been doing this for the past 6 months.  OTC pain meds have not been effective.    Review of Systems as detailed in HPI. All others reviewed and are negative.   Objective: Vital Signs: Ht 5\' 5"  (1.651 m)   Wt 215 lb 12.8 oz (97.9 kg)   BMI 35.91 kg/m   Physical Exam well-developed well-nourished female no acute distress. Alert oriented x3.  Ortho Exam left hip exam shows a markedly positive logroll and FADIR with very little internal rotation. She is neurovascular intact distally.  Specialty Comments:  No specialty comments available.  Imaging: No new imaging   PMFS History: Patient Active Problem List   Diagnosis Date Noted  . Bilateral thigh pain 01/29/2020  . Essential hypertension 01/29/2020   Past Medical History:  Diagnosis Date  . Arthritis   . Asthma   . Bronchitis   . Chronic bronchitis ([facility])   . Flesh-eating bacteria ([facility])    [place]  . Foot fracture, left     Family History  Adopted: Yes  Problem Relation Age of Onset  . Heart attack Mother   . Cancer Son        osteogenic carcinoma    Past Surgical History:  Procedure Laterality Date  . APPENDECTOMY    . MANDIBLE SURGERY    . TONSILLECTOMY    .  WISDOM TOOTH EXTRACTION     Social History   Occupational History  . Not on file  Tobacco Use  . Smoking status: Never Smoker  . Smokeless tobacco: Never Used  Vaping Use  . Vaping Use: Never used  Substance and Sexual Activity  . Alcohol use: Never  . Drug use: Never  . Sexual activity: Not Currently    Partners: Male

## 2020-02-05 NOTE — Addendum Note (Signed)
Addended by: Albertina Parr on: 02/05/2020 03:08 PM   Modules accepted: Orders

## 2020-02-06 ENCOUNTER — Other Ambulatory Visit: Payer: Self-pay

## 2020-02-06 ENCOUNTER — Other Ambulatory Visit: Payer: Self-pay | Admitting: Medical-Surgical

## 2020-02-06 ENCOUNTER — Encounter: Payer: Federal, State, Local not specified - PPO | Admitting: Physical Therapy

## 2020-02-06 DIAGNOSIS — J42 Unspecified chronic bronchitis: Secondary | ICD-10-CM | POA: Insufficient documentation

## 2020-02-06 DIAGNOSIS — J45909 Unspecified asthma, uncomplicated: Secondary | ICD-10-CM | POA: Insufficient documentation

## 2020-02-06 DIAGNOSIS — R079 Chest pain, unspecified: Secondary | ICD-10-CM

## 2020-02-06 DIAGNOSIS — J40 Bronchitis, not specified as acute or chronic: Secondary | ICD-10-CM | POA: Insufficient documentation

## 2020-02-06 DIAGNOSIS — R002 Palpitations: Secondary | ICD-10-CM

## 2020-02-06 DIAGNOSIS — S92902A Unspecified fracture of left foot, initial encounter for closed fracture: Secondary | ICD-10-CM | POA: Insufficient documentation

## 2020-02-06 DIAGNOSIS — M726 Necrotizing fasciitis: Secondary | ICD-10-CM | POA: Insufficient documentation

## 2020-02-06 DIAGNOSIS — M1990 Unspecified osteoarthritis, unspecified site: Secondary | ICD-10-CM | POA: Insufficient documentation

## 2020-02-06 LAB — EXTRA LAV TOP TUBE

## 2020-02-06 LAB — PREALBUMIN: Prealbumin: 26 mg/dL (ref 17–34)

## 2020-02-07 NOTE — Telephone Encounter (Signed)
Pt came by and picked up FMLA forms on 02/06/2020.

## 2020-02-08 ENCOUNTER — Ambulatory Visit: Payer: Federal, State, Local not specified - PPO | Admitting: Physical Therapy

## 2020-02-08 ENCOUNTER — Encounter: Payer: Self-pay | Admitting: Cardiology

## 2020-02-08 ENCOUNTER — Other Ambulatory Visit: Payer: Self-pay

## 2020-02-08 ENCOUNTER — Ambulatory Visit: Payer: Federal, State, Local not specified - PPO | Admitting: Cardiology

## 2020-02-08 VITALS — BP 145/85 | HR 83 | Ht 65.0 in | Wt 216.0 lb

## 2020-02-08 DIAGNOSIS — R011 Cardiac murmur, unspecified: Secondary | ICD-10-CM | POA: Diagnosis not present

## 2020-02-08 DIAGNOSIS — I471 Supraventricular tachycardia: Secondary | ICD-10-CM

## 2020-02-08 DIAGNOSIS — R079 Chest pain, unspecified: Secondary | ICD-10-CM

## 2020-02-08 DIAGNOSIS — I472 Ventricular tachycardia: Secondary | ICD-10-CM | POA: Diagnosis not present

## 2020-02-08 DIAGNOSIS — E669 Obesity, unspecified: Secondary | ICD-10-CM

## 2020-02-08 DIAGNOSIS — I4729 Other ventricular tachycardia: Secondary | ICD-10-CM

## 2020-02-08 DIAGNOSIS — R002 Palpitations: Secondary | ICD-10-CM

## 2020-02-08 MED ORDER — NITROGLYCERIN 0.4 MG SL SUBL: 0.4000 mg | SUBLINGUAL_TABLET | SUBLINGUAL | 5 refills | Status: DC | PRN

## 2020-02-08 MED ORDER — CARVEDILOL 6.25 MG PO TABS: 3.1250 mg | ORAL_TABLET | Freq: Two times a day (BID) | ORAL | 3 refills | Status: DC

## 2020-02-08 NOTE — Patient Instructions (Signed)
Medication Instructions:  Your physician has recommended you make the following change in your medication: 1. START COREG 3.125 MG TWICE DAILY.  2. START NITROGLYCERIN 0.4 MG SUBLINGUAL    *If you need a refill on your cardiac medications before your next appointment, please call your pharmacy*   Lab Work: NONE If you have labs (blood work) drawn today and your tests are completely normal, you will receive your results only by: Marland Kitchen MyChart Message (if you have MyChart) OR . A paper copy in the mail If you have any lab test that is abnormal or we need to change your treatment, we will call you to review the results.   Testing/Procedures: Your physician has requested that you have an echocardiogram. Echocardiography is a painless test that uses sound waves to create images of your heart. It provides your doctor with information about the size and shape of your heart and how well your heart's chambers and valves are working. This procedure takes approximately one hour. There are no restrictions for this procedure.  Your physician has requested that you have a lexiscan myoview. For further information please visit [email or web address] Please follow instruction sheet, as given.   Follow-Up: At [facility], you and your health needs are our priority.  As part of our continuing mission to provide you with exceptional heart care, we have created designated Provider Care Teams.  These Care Teams include your primary Cardiologist (physician) and Advanced Practice Providers (APPs -  Physician Assistants and Nurse Practitioners) who all work together to provide you with the care you need, when you need it.  We recommend signing up for the patient portal called "MyChart".  Sign up information is provided on this After Visit Summary.  MyChart is used to connect with patients for Virtual Visits (Telemedicine).  Patients are able to view lab/test results, encounter notes, upcoming appointments, etc.   Non-urgent messages can be sent to your provider as well.   To learn more about what you can do with MyChart, go to [email or web address].    Your next appointment:   3 month(s)  The format for your next appointment:   In Person  Provider:   Thomasene Ripple, DO

## 2020-02-08 NOTE — Progress Notes (Addendum)
Cardiology Office Note:    Date:  02/08/2020   ID:  Toni Jones, DOB 1960/05/30, MRN [id number]  PCP:  Christen Butter, NP  Cardiologist:  Thomasene Ripple, DO  Electrophysiologist:  None   Referring MD: Christen Butter, NP   " I have had some intermittent chest pain and palpitations"  History of Present Illness:    Toni Jones is a 59 y.o. female with a hx of hypertension recently started on losartan, her recent ZIO monitor showed evidence of nonsustained ventricular tachycardia, rare atrial tachycardia is here today to be evaluated for intermittent chest discomfort which lasts for sinks few minutes and then resolves.  This started about 2 weeks ago.  She notes that she has had some associated shortness of breath.  The chest discomfort is a stabbing feeling and as noted it only occurs for a few minutes prior to resolution.  Nothing make it better or worse.  She is here today and she is also planning hip surgery and would like to be cleared for her procedure.   Past Medical History:  Diagnosis Date  . Arthritis   . Asthma   . Bronchitis   . Chronic bronchitis (HCC)   . Flesh-eating bacteria (HCC)    [place]  . Foot fracture, left     Past Surgical History:  Procedure Laterality Date  . APPENDECTOMY    . MANDIBLE SURGERY    . TONSILLECTOMY    . WISDOM TOOTH EXTRACTION      Current Medications: Current Meds  Medication Sig  . albuterol (VENTOLIN HFA) 108 (90 Base) MCG/ACT inhaler Inhale 1-2 puffs into the lungs every 6 (six) hours as needed for wheezing or shortness of breath.  Marland Kitchen CALCIUM-VITAMIN D PO Take by mouth daily.  . ferrous gluconate (FERGON) 324 MG tablet Take 1 tablet (324 mg total) by mouth daily with breakfast.  . losartan (COZAAR) 25 MG tablet Take 1 tablet (25 mg total) by mouth daily.  . methocarbamol (ROBAXIN) 500 MG tablet Take 1 tablet (500 mg total) by mouth 3 (three) times daily.  . naproxen sodium (ALEVE) 220 MG tablet Take 220 mg by mouth daily as  needed (pain).     Allergies:   Onion and Red dye #40 [red dye]   Social History   Socioeconomic History  . Marital status: Divorced    Spouse name: Not on file  . Number of children: Not on file  . Years of education: Not on file  . Highest education level: Not on file  Occupational History  . Not on file  Tobacco Use  . Smoking status: Never Smoker  . Smokeless tobacco: Never Used  Vaping Use  . Vaping Use: Never used  Substance and Sexual Activity  . Alcohol use: Never  . Drug use: Never  . Sexual activity: Not Currently    Partners: Male  Other Topics Concern  . Not on file  Social History Narrative  . Not on file   Social Determinants of Health   Financial Resource Strain:   . Difficulty of Paying Living Expenses: Not on file  Food Insecurity:   . Worried About Programme researcher, broadcasting/film/video in the Last Year: Not on file  . Ran Out of Food in the Last Year: Not on file  Transportation Needs:   . Lack of Transportation (Medical): Not on file  . Lack of Transportation (Non-Medical): Not on file  Physical Activity:   . Days of Exercise per Week: Not on file  .  Minutes of Exercise per Session: Not on file  Stress:   . Feeling of Stress : Not on file  Social Connections:   . Frequency of Communication with Friends and Family: Not on file  . Frequency of Social Gatherings with Friends and Family: Not on file  . Attends Religious Services: Not on file  . Active Member of Clubs or Organizations: Not on file  . Attends BankerClub or Organization Meetings: Not on file  . Marital Status: Not on file     Family History: The patient's family history includes Cancer in her son; Heart attack in her mother. She was adopted.  ROS:   Review of Systems  Constitution: Negative for decreased appetite, fever and weight gain.  HENT: Negative for congestion, ear discharge, hoarse voice and sore throat.   Eyes: Negative for discharge, redness, vision loss in right eye and visual halos.    Cardiovascular: Reports intermittent chest pain, palpitations and dyspnea on exertion.  Negative for leg swelling and orthopnea.  Respiratory: Negative for cough, hemoptysis, shortness of breath and snoring.   Endocrine: Negative for heat intolerance and polyphagia.  Hematologic/Lymphatic: Negative for bleeding problem. Does not bruise/bleed easily.  Skin: Negative for flushing, nail changes, rash and suspicious lesions.  Musculoskeletal: Negative for arthritis, joint pain, muscle cramps, myalgias, neck pain and stiffness.  Gastrointestinal: Negative for abdominal pain, bowel incontinence, diarrhea and excessive appetite.  Genitourinary: Negative for decreased libido, genital sores and incomplete emptying.  Neurological: Negative for brief paralysis, focal weakness, headaches and loss of balance.  Psychiatric/Behavioral: Negative for altered mental status, depression and suicidal ideas.  Allergic/Immunologic: Negative for HIV exposure and persistent infections.    EKGs/Labs/Other Studies Reviewed:    The following studies were reviewed today:   EKG:  The ekg ordered today demonstrates sinus rhythm, heart rate 83 bpm nonspecific ST changes.  Zio Monitor 1. NSR with sinus brady and sinus tachycardia 2. NS atrial tachycardia 3. Rare PVC's and PAC's 4. Very brief NS VT 5. No prolonged pauses  Recent Labs: 01/15/2020: ALT 21; BUN 14; Creat 0.75; Hemoglobin 10.9; Platelets 505; Potassium 4.3; Sodium 146; TSH 1.83  Recent Lipid Panel No results found for: CHOL, TRIG, HDL, CHOLHDL, VLDL, LDLCALC, LDLDIRECT  Physical Exam:    VS:  BP (!) 145/85   Pulse 83   Ht 5\' 5"  (1.651 m)   Wt 216 lb (98 kg)   SpO2 98%   BMI 35.94 kg/m     Wt Readings from Last 3 Encounters:  02/08/20 216 lb (98 kg)  02/05/20 215 lb 12.8 oz (97.9 kg)  01/29/20 218 lb (98.9 kg)     GEN: Well nourished, well developed in no acute distress HEENT: Normal NECK: No JVD; No carotid bruits LYMPHATICS: No  lymphadenopathy CARDIAC: S1S2 noted,RRR, 2/6 mid systolic murmurs, rubs, gallops RESPIRATORY:  Clear to auscultation without rales, wheezing or rhonchi  ABDOMEN: Soft, non-tender, non-distended, +bowel sounds, no guarding. EXTREMITIES: No edema, No cyanosis, no clubbing MUSCULOSKELETAL:  No deformity  SKIN: Warm and dry NEUROLOGIC:  Alert and oriented x 3, non-focal PSYCHIATRIC:  Normal affect, good insight  ASSESSMENT:    1. Chest pain, unspecified type   2. Murmur   3. NSVT (nonsustained ventricular tachycardia) (HCC)   4. Atrial tachycardia (HCC)   5. Obesity (BMI 30-39.9)   6. Palpitations    PLAN:    Her chest pain is concerning given the patient does have risk factors including hypertension, obesity, her family history is unknown.  We will schedule  the patient for pharmacologic nuclear stress test for her chest pain and also her preoperative evaluation. Sublingual nitroglycerin prescription was sent, its protocol and 911 protocol explained and the patient vocalized understanding questions were answered to the patient's satisfaction  She does have a 2 out of 6 midsystolic ejection murmur on auscultation.  We will get an echocardiogram for completeness.  Her recent ZIO monitor showed nonsustained ventricular tachycardia which was a brief run as well as nonsustained atrial tachycardia.  Given that her blood pressure is still is elevated and her goal is less than 130/80 millimeters going to add low-dose beta-blocker to her regimen.  She will continue on her losartan.  I am hoping that this beta-blocker will also help with her symptoms of palpitations as well.  With her significant hip pain she is unable to exercise.  The patient is in agreement with the above plan. The patient left the office in stable condition.  The patient will follow up in 3 months or sooner if needed.   Medication Adjustments/Labs and Tests Ordered: Current medicines are reviewed at length with the patient  today.  Concerns regarding medicines are outlined above.  Orders Placed This Encounter  Procedures  . MYOCARDIAL PERFUSION IMAGING  . EKG 12-Lead  . ECHOCARDIOGRAM COMPLETE   Meds ordered this encounter  Medications  . carvedilol (COREG) 6.25 MG tablet    Sig: Take 0.5 tablets (3.125 mg total) by mouth 2 (two) times daily.    Dispense:  90 tablet    Refill:  3  . nitroGLYCERIN (NITROSTAT) 0.4 MG SL tablet    Sig: Place 1 tablet (0.4 mg total) under the tongue every 5 (five) minutes as needed for chest pain.    Dispense:  25 tablet    Refill:  5    Patient Instructions  Medication Instructions:  Your physician has recommended you make the following change in your medication: 1. START COREG 3.125 MG TWICE DAILY.  2. START NITROGLYCERIN 0.4 MG SUBLINGUAL    *If you need a refill on your cardiac medications before your next appointment, please call your pharmacy*   Lab Work: NONE If you have labs (blood work) drawn today and your tests are completely normal, you will receive your results only by: Marland Kitchen MyChart Message (if you have MyChart) OR . A paper copy in the mail If you have any lab test that is abnormal or we need to change your treatment, we will call you to review the results.   Testing/Procedures: Your physician has requested that you have an echocardiogram. Echocardiography is a painless test that uses sound waves to create images of your heart. It provides your doctor with information about the size and shape of your heart and how well your heart's chambers and valves are working. This procedure takes approximately one hour. There are no restrictions for this procedure.  Your physician has requested that you have a lexiscan myoview. For further information please visit [email or web address] Please follow instruction sheet, as given.   Follow-Up: At [facility], you and your health needs are our priority.  As part of our continuing mission to provide you with  exceptional heart care, we have created designated Provider Care Teams.  These Care Teams include your primary Cardiologist (physician) and Advanced Practice Providers (APPs -  Physician Assistants and Nurse Practitioners) who all work together to provide you with the care you need, when you need it.  We recommend signing up for the patient portal called "MyChart".  Sign up information  is provided on this After Visit Summary.  MyChart is used to connect with patients for Virtual Visits (Telemedicine).  Patients are able to view lab/test results, encounter notes, upcoming appointments, etc.  Non-urgent messages can be sent to your provider as well.   To learn more about what you can do with MyChart, go to [email or web address].    Your next appointment:   3 month(s)  The format for your next appointment:   In Person  Provider:   Thomasene Ripple, DO       Adopting a Healthy Lifestyle.  Know what a healthy weight is for you (roughly BMI <25) and aim to maintain this   Aim for 7+ servings of fruits and vegetables daily   65-80+ fluid ounces of water or unsweet tea for healthy kidneys   Limit to max 1 drink of alcohol per day; avoid smoking/tobacco   Limit animal fats in diet for cholesterol and heart health - choose grass fed whenever available   Avoid highly processed foods, and foods high in saturated/trans fats   Aim for low stress - take time to unwind and care for your mental health   Aim for 150 min of moderate intensity exercise weekly for heart health, and weights twice weekly for bone health   Aim for 7-9 hours of sleep daily   When it comes to diets, agreement about the perfect plan isnt easy to find, even among the experts. Experts at the [facility] [employer] developed an idea known as the Healthy Eating Plate. Just imagine a plate divided into logical, healthy portions.   The emphasis is on diet quality:   Load up on vegetables and fruits - one-half of  your plate: Aim for color and variety, and remember that potatoes dont count.   Go for whole grains - one-quarter of your plate: Whole wheat, barley, wheat berries, quinoa, oats, brown rice, and foods made with them. If you want pasta, go with whole wheat pasta.   Protein power - one-quarter of your plate: Fish, chicken, beans, and nuts are all healthy, versatile protein sources. Limit red meat.   The diet, however, does go beyond the plate, offering a few other suggestions.   Use healthy plant oils, such as olive, canola, soy, corn, sunflower and peanut. Check the labels, and avoid partially hydrogenated oil, which have unhealthy trans fats.   If youre thirsty, drink water. Coffee and tea are good in moderation, but skip sugary drinks and limit milk and dairy products to one or two daily servings.   The type of carbohydrate in the diet is more important than the amount. Some sources of carbohydrates, such as vegetables, fruits, whole grains, and beans-are healthier than others.   Finally, stay active  Signed, Thomasene Ripple, DO  02/08/2020 8:46 PM    [facility]

## 2020-02-11 ENCOUNTER — Other Ambulatory Visit: Payer: Self-pay | Admitting: Physician Assistant

## 2020-02-11 ENCOUNTER — Telehealth (HOSPITAL_COMMUNITY): Payer: Self-pay

## 2020-02-11 MED ORDER — ONDANSETRON HCL 4 MG PO TABS: 4.0000 mg | ORAL_TABLET | Freq: Three times a day (TID) | ORAL | 0 refills | Status: DC | PRN

## 2020-02-11 MED ORDER — OXYCODONE-ACETAMINOPHEN 5-325 MG PO TABS
1.0000 | ORAL_TABLET | Freq: Four times a day (QID) | ORAL | 0 refills | Status: DC | PRN
Start: 2020-02-11 — End: 2020-04-21

## 2020-02-11 MED ORDER — ASPIRIN EC 81 MG PO TBEC: 81.0000 mg | DELAYED_RELEASE_TABLET | Freq: Two times a day (BID) | ORAL | 0 refills | Status: DC

## 2020-02-11 MED ORDER — METHOCARBAMOL 500 MG PO TABS: 500.0000 mg | ORAL_TABLET | Freq: Two times a day (BID) | ORAL | 0 refills | Status: DC | PRN

## 2020-02-11 NOTE — Telephone Encounter (Signed)
Detailed instructions left on the patient's answering machine. Asked to call back with any questions. S.Arne Schlender EMTP 

## 2020-02-12 ENCOUNTER — Other Ambulatory Visit: Payer: Self-pay

## 2020-02-12 ENCOUNTER — Ambulatory Visit (HOSPITAL_COMMUNITY)
Admission: RE | Admit: 2020-02-12 | Discharge: 2020-02-12 | Disposition: A | Discharge status: Home / Self Care | Payer: Federal, State, Local not specified - PPO | Source: Ambulatory Visit | Attending: Cardiology | Admitting: Cardiology

## 2020-02-12 ENCOUNTER — Ambulatory Visit (INDEPENDENT_AMBULATORY_CARE_PROVIDER_SITE_OTHER): Payer: Federal, State, Local not specified - PPO | Admitting: Medical-Surgical

## 2020-02-12 VITALS — BP 124/59 | HR 57

## 2020-02-12 DIAGNOSIS — I471 Supraventricular tachycardia: Secondary | ICD-10-CM | POA: Diagnosis not present

## 2020-02-12 DIAGNOSIS — E669 Obesity, unspecified: Secondary | ICD-10-CM | POA: Diagnosis not present

## 2020-02-12 DIAGNOSIS — R011 Cardiac murmur, unspecified: Secondary | ICD-10-CM | POA: Insufficient documentation

## 2020-02-12 DIAGNOSIS — I10 Essential (primary) hypertension: Secondary | ICD-10-CM | POA: Insufficient documentation

## 2020-02-12 LAB — ECHOCARDIOGRAM COMPLETE
Area-P 1/2: 5.38 cm2
S' Lateral: 3.2 cm

## 2020-02-12 NOTE — Progress Notes (Signed)
  Echocardiogram 2D Echocardiogram has been performed.  Toni Jones 02/12/2020, 4:06 PM

## 2020-02-12 NOTE — Progress Notes (Signed)
Established Patient Office Visit  Subjective:  Patient ID: Toni Jones, female    DOB: [birth year]  Age: 59 y.o. MRN: [id number]  CC: No chief complaint on file.   HPI Toni Jones presents for bp check. Denies sob dizziness and chest pains.  Past Medical History:  Diagnosis Date  . Arthritis   . Asthma   . Bronchitis   . Chronic bronchitis (HCC)   . Flesh-eating bacteria (HCC)    [place]  . Foot fracture, left     Past Surgical History:  Procedure Laterality Date  . APPENDECTOMY    . MANDIBLE SURGERY    . TONSILLECTOMY    . WISDOM TOOTH EXTRACTION      Family History  Adopted: Yes  Problem Relation Age of Onset  . Heart attack Mother   . Cancer Son        osteogenic carcinoma    Social History   Socioeconomic History  . Marital status: Divorced    Spouse name: Not on file  . Number of children: Not on file  . Years of education: Not on file  . Highest education level: Not on file  Occupational History  . Not on file  Tobacco Use  . Smoking status: Never Smoker  . Smokeless tobacco: Never Used  Vaping Use  . Vaping Use: Never used  Substance and Sexual Activity  . Alcohol use: Never  . Drug use: Never  . Sexual activity: Not Currently    Partners: Male  Other Topics Concern  . Not on file  Social History Narrative  . Not on file   Social Determinants of Health   Financial Resource Strain:   . Difficulty of Paying Living Expenses: Not on file  Food Insecurity:   . Worried About Programme researcher, broadcasting/film/video in the Last Year: Not on file  . Ran Out of Food in the Last Year: Not on file  Transportation Needs:   . Lack of Transportation (Medical): Not on file  . Lack of Transportation (Non-Medical): Not on file  Physical Activity:   . Days of Exercise per Week: Not on file  . Minutes of Exercise per Session: Not on file  Stress:   . Feeling of Stress : Not on file  Social Connections:   . Frequency of Communication with Friends and Family:  Not on file  . Frequency of Social Gatherings with Friends and Family: Not on file  . Attends Religious Services: Not on file  . Active Member of Clubs or Organizations: Not on file  . Attends Banker Meetings: Not on file  . Marital Status: Not on file  Intimate Partner Violence:   . Fear of Current or Ex-Partner: Not on file  . Emotionally Abused: Not on file  . Physically Abused: Not on file  . Sexually Abused: Not on file    Outpatient Medications Prior to Visit  Medication Sig Dispense Refill  . albuterol (VENTOLIN HFA) 108 (90 Base) MCG/ACT inhaler Inhale 1-2 puffs into the lungs every 6 (six) hours as needed for wheezing or shortness of breath. 1 Inhaler 0  . aspirin EC 81 MG tablet Take 1 tablet (81 mg total) by mouth in the morning and at bedtime. To be taken post-op 84 tablet 0  . baclofen (LIORESAL) 10 MG tablet Take 10 mg by mouth 3 (three) times daily as needed for muscle spasms.    Marland Kitchen CALCIUM-VITAMIN D PO Take 2 each by mouth daily. Gummies    .  carvedilol (COREG) 6.25 MG tablet Take 0.5 tablets (3.125 mg total) by mouth 2 (two) times daily. 90 tablet 3  . ferrous gluconate (FERGON) 324 MG tablet Take 1 tablet (324 mg total) by mouth daily with breakfast. 90 tablet 1  . losartan (COZAAR) 25 MG tablet Take 1 tablet (25 mg total) by mouth daily. 30 tablet 1  . magnesium gluconate (MAGONATE) 500 MG tablet Take 1,000 mg by mouth daily.    . methocarbamol (ROBAXIN) 500 MG tablet Take 1 tablet (500 mg total) by mouth 2 (two) times daily as needed. To be taken post-op (Patient taking differently: Take 500 mg by mouth 3 (three) times daily as needed for muscle spasms. To be taken post-op) 20 tablet 0  . Multiple Vitamins-Minerals (MULTIVITAMIN ADULT) CHEW Chew 2 each by mouth daily.    . naproxen sodium (ALEVE) 220 MG tablet Take 440 mg by mouth daily as needed (pain).     . nitroGLYCERIN (NITROSTAT) 0.4 MG SL tablet Place 1 tablet (0.4 mg total) under the tongue every 5  (five) minutes as needed for chest pain. 25 tablet 5  . ondansetron (ZOFRAN) 4 MG tablet Take 1 tablet (4 mg total) by mouth every 8 (eight) hours as needed for nausea or vomiting. 40 tablet 0  . oxyCODONE-acetaminophen (PERCOCET) 5-325 MG tablet Take 1-2 tablets by mouth every 6 (six) hours as needed. To be taken post-op 40 tablet 0   No facility-administered medications prior to visit.    Allergies  Allergen Reactions  . Onion Other (See Comments)    Nose bleeds  . Red Dye #40 [Red Dye] Hives    ROS Review of Systems    Objective:    Physical Exam  BP (!) 151/57   Pulse (!) 57   SpO2 100%  Wt Readings from Last 3 Encounters:  02/08/20 216 lb (98 kg)  02/05/20 215 lb 12.8 oz (97.9 kg)  01/29/20 218 lb (98.9 kg)     Health Maintenance Due  Topic Date Due  . PAP SMEAR-Modifier  Never done    There are no preventive care reminders to display for this patient.  Lab Results  Component Value Date   TSH 1.83 01/15/2020   Lab Results  Component Value Date   WBC 10.9 (H) 01/15/2020   HGB 10.9 (L) 01/15/2020   HCT 35.7 01/15/2020   MCV 70.0 (L) 01/15/2020   PLT 505 (H) 01/15/2020   Lab Results  Component Value Date   NA 146 01/15/2020   K 4.3 01/15/2020   CO2 28 01/15/2020   GLUCOSE 95 01/15/2020   BUN 14 01/15/2020   CREATININE 0.75 01/15/2020   BILITOT 0.3 01/15/2020   ALKPHOS 73 12/01/2017   AST 21 01/15/2020   ALT 21 01/15/2020   PROT 6.8 01/15/2020   ALBUMIN 4.6 12/01/2017   CALCIUM 9.9 01/15/2020   ANIONGAP 9 08/01/2018   No results found for: CHOL No results found for: HDL No results found for: LDLCALC No results found for: TRIG No results found for: CHOLHDL No results found for: WEXH3Z    Assessment & Plan:  HTN bp  controlled on 2nd recheck pt. Advised to follow up 3 month with Joy. Problem List Items Addressed This Visit      Cardiovascular and Mediastinum   Essential hypertension - Primary      No orders of the defined types were  placed in this encounter.   Follow-up: No follow-ups on file.    Thad Ranger, RMA

## 2020-02-13 ENCOUNTER — Encounter (HOSPITAL_COMMUNITY): Admission: RE | Admit: 2020-02-13 | Payer: Federal, State, Local not specified - PPO | Source: Ambulatory Visit

## 2020-02-13 NOTE — Progress Notes (Signed)
[facility] [phone] - [place], [place] - [address] [place]  [place] [address] Phone: [phone] Fax: [phone]      Your procedure is scheduled on December 13  Report to [facility] Main Entrance "A" at 1:00 P.M., and check in at the Admitting office.  Call this number if you have problems the morning of surgery:  [phone]  Call [phone] if you have any questions prior to your surgery date Monday-Friday 8am-4pm    Remember:  Do not eat after midnight the night before your surgery  You may drink clear liquids until 1200 pm the afternoon of your surgery.   Clear liquids allowed are: Water, Non-Citrus Juices (without pulp), Carbonated Beverages, Clear Tea, Black Coffee Only, and Gatorade    Enhanced Recovery after Surgery for Orthopedics Enhanced Recovery after Surgery is a protocol used to improve the stress on your body and your recovery after surgery.  Patient Instructions  . The night before surgery:  o No food after midnight. ONLY clear liquids after midnight  .  Marland Kitchen The day of surgery (if you do NOT have diabetes):  o Drink ONE (1) Pre-Surgery Clear Ensure by _1200____ Pm the afternoon of surgery   o This drink was given to you during your hospital  pre-op appointment visit. o Nothing else to drink after completing the  Pre-Surgery Clear Ensure..         If you have questions, please contact your surgeon's office.    Take these medicines the morning of surgery with A SIP OF WATER  albuterol (VENTOLIN HFA) if needed, Please bring all inhalers with you the day of surgery.  baclofen (LIORESAL) carvedilol (COREG)  methocarbamol (ROBAXIN) if needed oxyCODONE-acetaminophen (PERCOCET)  If needed for pain  Follow your surgeon's instructions on when to stop Aspirin.  If no instructions were given by your surgeon then you will need to call the office to get those instructions.    As of today, STOP taking any Aspirin (unless  otherwise instructed by your surgeon) Aleve, Naproxen, Ibuprofen, Motrin, Advil, Goody's, BC's, all herbal medications, fish oil, and all vitamins.                      Do not wear jewelry, make up, or nail polish            Do not wear lotions, powders, perfumes/colognes, or deodorant.            Do not shave 48 hours prior to surgery.  Men may shave face and neck.            Do not bring valuables to the hospital.            [facility] is not responsible for any belongings or valuables.  Do NOT Smoke (Tobacco/Vaping) or drink Alcohol 24 hours prior to your procedure If you use a CPAP at night, you may bring all equipment for your overnight stay.   Contacts, glasses, dentures or bridgework may not be worn into surgery.      For patients admitted to the hospital, discharge time will be determined by your treatment team.   Patients discharged the day of surgery will not be allowed to drive home, and someone needs to stay with them for 24 hours.    Special instructions:   [place]- Preparing For Surgery  Before surgery, you can play an important role. Because skin is not sterile, your skin needs to be as free of germs  as possible. You can reduce the number of germs on your skin by washing with CHG (chlorahexidine gluconate) Soap before surgery.  CHG is an antiseptic cleaner which kills germs and bonds with the skin to continue killing germs even after washing.    Oral Hygiene is also important to reduce your risk of infection.  Remember - BRUSH YOUR TEETH THE MORNING OF SURGERY WITH YOUR REGULAR TOOTHPASTE  Please do not use if you have an allergy to CHG or antibacterial soaps. If your skin becomes reddened/irritated stop using the CHG.  Do not shave (including legs and underarms) for at least 48 hours prior to first CHG shower. It is OK to shave your face.  Please follow these instructions carefully.   1. Shower the NIGHT BEFORE SURGERY and the MORNING OF SURGERY with CHG Soap.    2. If you chose to wash your hair, wash your hair first as usual with your normal shampoo.  3. After you shampoo, rinse your hair and body thoroughly to remove the shampoo.  4. Use CHG as you would any other liquid soap. You can apply CHG directly to the skin and wash gently with a scrungie or a clean washcloth.   5. Apply the CHG Soap to your body ONLY FROM THE NECK DOWN.  Do not use on open wounds or open sores. Avoid contact with your eyes, ears, mouth and genitals (private parts). Wash Face and genitals (private parts)  with your normal soap.   6. Wash thoroughly, paying special attention to the area where your surgery will be performed.  7. Thoroughly rinse your body with warm water from the neck down.  8. DO NOT shower/wash with your normal soap after using and rinsing off the CHG Soap.  9. Pat yourself dry with a CLEAN TOWEL.  10. Wear CLEAN PAJAMAS to bed the night before surgery  11. Place CLEAN SHEETS on your bed the night of your first shower and DO NOT SLEEP WITH PETS.   Day of Surgery: Wear Clean/Comfortable clothing the morning of surgery Do not apply any deodorants/lotions.   Remember to brush your teeth WITH YOUR REGULAR TOOTHPASTE.   Please read over the following fact sheets that you were given.

## 2020-02-14 ENCOUNTER — Other Ambulatory Visit: Payer: Self-pay

## 2020-02-14 ENCOUNTER — Ambulatory Visit (HOSPITAL_COMMUNITY): Payer: Federal, State, Local not specified - PPO | Attending: Internal Medicine

## 2020-02-14 ENCOUNTER — Encounter (HOSPITAL_COMMUNITY): Payer: Self-pay | Admitting: Orthopaedic Surgery

## 2020-02-14 ENCOUNTER — Other Ambulatory Visit (HOSPITAL_COMMUNITY): Payer: Federal, State, Local not specified - PPO

## 2020-02-14 DIAGNOSIS — R079 Chest pain, unspecified: Secondary | ICD-10-CM

## 2020-02-14 LAB — MYOCARDIAL PERFUSION IMAGING
LV dias vol: 77 mL (ref 46–106)
LV sys vol: 25 mL
Peak HR: 93 {beats}/min
Rest HR: 63 {beats}/min
SDS: 0
SRS: 0
SSS: 0
TID: 1.02

## 2020-02-14 MED ORDER — REGADENOSON 0.4 MG/5ML IV SOLN
0.4000 mg | Freq: Once | INTRAVENOUS | Status: AC
  Administered 2020-02-14: 0.4 mg via INTRAVENOUS

## 2020-02-14 MED ORDER — TECHNETIUM TC 99M TETROFOSMIN IV KIT
10.4000 | PACK | Freq: Once | INTRAVENOUS | Status: AC | PRN
  Administered 2020-02-14: 10.4 via INTRAVENOUS
  Filled 2020-02-14: qty 11

## 2020-02-14 MED ORDER — TECHNETIUM TC 99M TETROFOSMIN IV KIT
31.2000 | PACK | Freq: Once | INTRAVENOUS | Status: AC | PRN
  Administered 2020-02-14: 31.2 via INTRAVENOUS
  Filled 2020-02-14: qty 32

## 2020-02-14 NOTE — Progress Notes (Signed)
DUE TO COVID-19 ONLY ONE VISITOR IS ALLOWED TO COME WITH YOU AND STAY IN THE WAITING ROOM ONLY DURING PRE OP AND PROCEDURE DAY OF SURGERY. THE 1 VISITOR  MAY VISIT WITH YOU AFTER SURGERY IN YOUR PRIVATE ROOM DURING VISITING HOURS ONLY!  YOU NEED TO HAVE A COVID 19 TEST ON_12/12/2019 ______ @_______ , THIS TEST MUST BE DONE BEFORE SURGERY,  COVID TESTING SITE [address] [place] , IT IS ON THE RIGHT GOING OUT [address] APPROXIMATELY  2 MINUTES PAST [employer] ON THE RIGHT. ONCE YOUR COVID TEST IS COMPLETED,  PLEASE BEGIN THE QUARANTINE INSTRUCTIONS AS OUTLINED IN YOUR HANDOUT.                Toni Jones  02/14/2020   Your procedure is scheduled on:  02/18/2020   Report to [facility] Main  Entrance   Report to admitting at     1000 AM     Call this number if you have problems the morning of surgery [phone]    REMEMBER: NO  SOLID FOOD CANDY OR GUM AFTER MIDNIGHT. CLEAR LIQUIDS UNTIL 0930am         . NOTHING BY MOUTH EXCEPT CLEAR LIQUIDS UNTIL    . PLEASE FINISH ENSURE DRINK PER SURGEON ORDER  WHICH NEEDS TO BE COMPLETED AT 0930am      .      CLEAR LIQUID DIET   Foods Allowed                                                                    Coffee and tea, regular and decaf                            Fruit ices (not with fruit pulp)                                      Iced Popsicles                                    Carbonated beverages, regular and diet                                    Cranberry, grape and apple juices Sports drinks like Gatorade Lightly seasoned clear broth or consume(fat free) Sugar, honey syrup ___________________________________________________________________      BRUSH YOUR TEETH MORNING OF SURGERY AND RINSE YOUR MOUTH OUT, NO CHEWING GUM CANDY OR MINTS.     Take these medicines the morning of surgery with A SIP OF WATER: Inhalers as usual and bring, coreg, magnesium   DO NOT TAKE ANY DIABETIC  MEDICATIONS DAY OF YOUR SURGERY                               You may not have any metal on your body including hair pins and  piercings  Do not wear jewelry, make-up, lotions, powders or perfumes, deodorant             Do not wear nail polish on your fingernails.  Do not shave  48 hours prior to surgery.              Men may shave face and neck.   Do not bring valuables to the hospital. [place].  Contacts, dentures or bridgework may not be worn into surgery.  Leave suitcase in the car. After surgery it may be brought to your room.     Patients discharged the day of surgery will not be allowed to drive home. IF YOU ARE HAVING SURGERY AND GOING HOME THE SAME DAY, YOU MUST HAVE AN ADULT TO DRIVE YOU HOME AND BE WITH YOU FOR 24 HOURS. YOU MAY GO HOME BY TAXI OR UBER OR ORTHERWISE, BUT AN ADULT MUST ACCOMPANY YOU HOME AND STAY WITH YOU FOR 24 HOURS.  Name and phone number of your driver:  Special Instructions: N/A              Please read over the following fact sheets you were given: _____________________________________________________________________  [facility] - Preparing for Surgery Before surgery, you can play an important role.  Because skin is not sterile, your skin needs to be as free of germs as possible.  You can reduce the number of germs on your skin by washing with CHG (chlorahexidine gluconate) soap before surgery.  CHG is an antiseptic cleaner which kills germs and bonds with the skin to continue killing germs even after washing. Please DO NOT use if you have an allergy to CHG or antibacterial soaps.  If your skin becomes reddened/irritated stop using the CHG and inform your nurse when you arrive at Short Stay. Do not shave (including legs and underarms) for at least 48 hours prior to the first CHG shower.  You may shave your face/neck. Please follow these instructions carefully:  1.  Shower with CHG Soap the night  before surgery and the  morning of Surgery.  2.  If you choose to wash your hair, wash your hair first as usual with your  normal  shampoo.  3.  After you shampoo, rinse your hair and body thoroughly to remove the  shampoo.                           4.  Use CHG as you would any other liquid soap.  You can apply chg directly  to the skin and wash                       Gently with a scrungie or clean washcloth.  5.  Apply the CHG Soap to your body ONLY FROM THE NECK DOWN.   Do not use on face/ open                           Wound or open sores. Avoid contact with eyes, ears mouth and genitals (private parts).                       Wash face,  Genitals (private parts) with your normal soap.             6.  Wash thoroughly, paying special attention to the area where your surgery  will be performed.  7.  Thoroughly rinse your body with warm water from the neck down.  8.  DO NOT shower/wash with your normal soap after using and rinsing off  the CHG Soap.                9.  Pat yourself dry with a clean towel.            10.  Wear clean pajamas.            11.  Place clean sheets on your bed the night of your first shower and do not  sleep with pets. Day of Surgery : Do not apply any lotions/deodorants the morning of surgery.  Please wear clean clothes to the hospital/surgery center.  FAILURE TO FOLLOW THESE INSTRUCTIONS MAY RESULT IN THE CANCELLATION OF YOUR SURGERY PATIENT SIGNATURE_________________________________  NURSE SIGNATURE__________________________________  ________________________________________________________________________

## 2020-02-15 ENCOUNTER — Other Ambulatory Visit (HOSPITAL_COMMUNITY)
Admission: RE | Admit: 2020-02-15 | Discharge: 2020-02-15 | Disposition: A | Discharge status: Home / Self Care | Payer: Federal, State, Local not specified - PPO | Source: Ambulatory Visit | Attending: Orthopaedic Surgery | Admitting: Orthopaedic Surgery

## 2020-02-15 ENCOUNTER — Encounter (HOSPITAL_COMMUNITY): Payer: Self-pay

## 2020-02-15 ENCOUNTER — Encounter (HOSPITAL_COMMUNITY)
Admission: RE | Admit: 2020-02-15 | Discharge: 2020-02-15 | Disposition: A | Discharge status: Home / Self Care | Payer: Federal, State, Local not specified - PPO | Source: Ambulatory Visit | Attending: Orthopaedic Surgery | Admitting: Orthopaedic Surgery

## 2020-02-15 ENCOUNTER — Other Ambulatory Visit: Payer: Self-pay

## 2020-02-15 ENCOUNTER — Telehealth: Payer: Self-pay

## 2020-02-15 ENCOUNTER — Ambulatory Visit (HOSPITAL_COMMUNITY)
Admission: RE | Admit: 2020-02-15 | Discharge: 2020-02-15 | Disposition: A | Discharge status: Home / Self Care | Payer: Federal, State, Local not specified - PPO | Source: Ambulatory Visit | Attending: Physician Assistant | Admitting: Physician Assistant

## 2020-02-15 DIAGNOSIS — Z01818 Encounter for other preprocedural examination: Secondary | ICD-10-CM | POA: Diagnosis not present

## 2020-02-15 DIAGNOSIS — M1612 Unilateral primary osteoarthritis, left hip: Secondary | ICD-10-CM

## 2020-02-15 DIAGNOSIS — Z20822 Contact with and (suspected) exposure to covid-19: Secondary | ICD-10-CM | POA: Insufficient documentation

## 2020-02-15 DIAGNOSIS — M1712 Unilateral primary osteoarthritis, left knee: Secondary | ICD-10-CM

## 2020-02-15 DIAGNOSIS — Z01812 Encounter for preprocedural laboratory examination: Secondary | ICD-10-CM | POA: Insufficient documentation

## 2020-02-15 DIAGNOSIS — I700 Atherosclerosis of aorta: Secondary | ICD-10-CM | POA: Diagnosis not present

## 2020-02-15 HISTORY — DX: Pneumonia, unspecified organism: J18.9

## 2020-02-15 HISTORY — DX: Nausea with vomiting, unspecified: R11.2

## 2020-02-15 HISTORY — DX: Other specified postprocedural states: Z98.890

## 2020-02-15 LAB — CBC WITH DIFFERENTIAL/PLATELET
Abs Immature Granulocytes: 0.03 10*3/uL (ref 0.00–0.07)
Basophils Absolute: 0.1 10*3/uL (ref 0.0–0.1)
Basophils Relative: 1 %
Eosinophils Absolute: 1.4 10*3/uL — ABNORMAL HIGH (ref 0.0–0.5)
Eosinophils Relative: 14 %
HCT: 36.1 % (ref 36.0–46.0)
Hemoglobin: 10.7 g/dL — ABNORMAL LOW (ref 12.0–15.0)
Immature Granulocytes: 0 %
Lymphocytes Relative: 19 %
Lymphs Abs: 1.9 10*3/uL (ref 0.7–4.0)
MCH: 21.4 pg — ABNORMAL LOW (ref 26.0–34.0)
MCHC: 29.6 g/dL — ABNORMAL LOW (ref 30.0–36.0)
MCV: 72.1 fL — ABNORMAL LOW (ref 80.0–100.0)
Monocytes Absolute: 0.9 10*3/uL (ref 0.1–1.0)
Monocytes Relative: 9 %
Neutro Abs: 5.8 10*3/uL (ref 1.7–7.7)
Neutrophils Relative %: 57 %
Platelets: 478 10*3/uL — ABNORMAL HIGH (ref 150–400)
RBC: 5.01 MIL/uL (ref 3.87–5.11)
RDW: 18.3 % — ABNORMAL HIGH (ref 11.5–15.5)
WBC: 10 10*3/uL (ref 4.0–10.5)
nRBC: 0 % (ref 0.0–0.2)

## 2020-02-15 LAB — COMPREHENSIVE METABOLIC PANEL
ALT: 18 U/L (ref 0–44)
AST: 19 U/L (ref 15–41)
Albumin: 4.4 g/dL (ref 3.5–5.0)
Alkaline Phosphatase: 72 U/L (ref 38–126)
Anion gap: 8 (ref 5–15)
BUN: 20 mg/dL (ref 6–20)
CO2: 29 mmol/L (ref 22–32)
Calcium: 9.7 mg/dL (ref 8.9–10.3)
Chloride: 102 mmol/L (ref 98–111)
Creatinine, Ser: 0.77 mg/dL (ref 0.44–1.00)
GFR, Estimated: >60 mL/min (ref >60)
Glucose, Bld: 97 mg/dL (ref 70–99)
Potassium: 4.3 mmol/L (ref 3.5–5.1)
Sodium: 139 mmol/L (ref 135–145)
Total Bilirubin: 0.5 mg/dL (ref 0.3–1.2)
Total Protein: 7.1 g/dL (ref 6.5–8.1)

## 2020-02-15 LAB — URINALYSIS, ROUTINE W REFLEX MICROSCOPIC
Bilirubin Urine: NEGATIVE
Glucose, UA: NEGATIVE mg/dL
Hgb urine dipstick: NEGATIVE
Ketones, ur: NEGATIVE mg/dL
Nitrite: NEGATIVE
Protein, ur: NEGATIVE mg/dL
Specific Gravity, Urine: 1.018 (ref 1.005–1.030)
WBC, UA: >50 WBC/hpf — ABNORMAL HIGH (ref 0–5)
pH: 6 (ref 5.0–8.0)

## 2020-02-15 LAB — SARS CORONAVIRUS 2 (TAT 6-24 HRS): SARS Coronavirus 2: NEGATIVE

## 2020-02-15 LAB — SURGICAL PCR SCREEN
MRSA, PCR: NEGATIVE
Staphylococcus aureus: NEGATIVE

## 2020-02-15 LAB — APTT: aPTT: 28 seconds (ref 24–36)

## 2020-02-15 LAB — PROTIME-INR
INR: 1 (ref 0.8–1.2)
Prothrombin Time: 12.9 seconds (ref 11.4–15.2)

## 2020-02-15 NOTE — Progress Notes (Addendum)
Anesthesia Review:  PCP: Cardiologist :Dr Krystal Clark 02/08/2020  Chest x-ray : 02/15/2020  EKG :02/11/2020  Echo : 02/12/2020  Stress test:02/14/2020  Monitor 02/06/2020  Cardiac Cath :  Activity level: could od a flight of stairs without difficulty except for hip  Sleep Study/ CPAP : no  Fasting Blood Sugar :      / Checks Blood Sugar -- times a day:   Blood Thinner/ Instructions /Last Dose: ASA / Instructions/ Last Dose :  U/A and CBC results of 02/15/20 routed to DR Roda Shutters.

## 2020-02-15 NOTE — Telephone Encounter (Signed)
Spoke with patient regarding results and recommendation.  Patient verbalizes understanding and is agreeable to plan of care. Advised patient to call back with any issues or concerns.  

## 2020-02-15 NOTE — Progress Notes (Signed)
Anesthesia Chart Review   Case: [id number] Date/Time: 02/18/20 0700   Procedure: LEFT TOTAL HIP ARTHROPLASTY ANTERIOR APPROACH (Left Hip)   Anesthesia type: Spinal   Pre-op diagnosis: left hip degenerative joint disease   Location: [facility] ROOM 07 / [facility]   Surgeons: Tarry Kos, MD      DISCUSSION:59 y.o. never smoker with h/o PONV, asthma, HTN, left hip djd scheduled for above procedure 02/18/2020 with Dr. Gershon Mussel.   Pt seen by cardiology with intermittent chest discomfort, nonsustained Vtach on ZIO monitor.  Stress test 02/14/20 no ischemia, low risk study.  Echo 02/12/2020 with EF 60-65%, no valvular problems.   Anticipate pt can proceed with planned procedure barring acute status change.   VS: BP (!) 142/55   Pulse 69   Temp 36.7 C (Oral)   Resp 18   Ht 5\' 5"  (1.651 m)   Wt 99 kg   SpO2 98%   BMI 36.32 kg/m   PROVIDERS: , NP is PCP    LABS: Labs reviewed: Acceptable for surgery. (all labs ordered are listed, but only abnormal results are displayed)  Labs Reviewed  CBC WITH DIFFERENTIAL/PLATELET - Abnormal; Notable for the following components:      Result Value   Hemoglobin 10.7 (*)    MCV 72.1 (*)    MCH 21.4 (*)    MCHC 29.6 (*)    RDW 18.3 (*)    Platelets 478 (*)    Eosinophils Absolute 1.4 (*)    All other components within normal limits  URINALYSIS, ROUTINE W REFLEX MICROSCOPIC - Abnormal; Notable for the following components:   APPearance HAZY (*)    Leukocytes,Ua LARGE (*)    WBC, UA >50 (*)    Bacteria, UA FEW (*)    All other components within normal limits  SURGICAL PCR SCREEN  COMPREHENSIVE METABOLIC PANEL  PROTIME-INR  APTT  TYPE AND SCREEN     IMAGES:   EKG: 02/11/20 Rate 83 bpm  NSR  CV: Stress Test 02/14/2020   The left ventricular ejection fraction is hyperdynamic (>65%).  Nuclear stress EF: 67%.  There was no ST segment deviation noted during stress.  The study is normal.  This is a low risk study.   No  ischemia or infarction on perfusion images.   Echo 02/12/2020 IMPRESSIONS    1. Left ventricular ejection fraction, by estimation, is 60 to 65%. The  left ventricle has normal function. The left ventricle has no regional  wall motion abnormalities. Left ventricular diastolic parameters were  normal.  2. Right ventricular systolic function is normal. The right ventricular  size is normal.  3. The mitral valve is normal in structure. Trivial mitral valve  regurgitation. No evidence of mitral stenosis.  4. The aortic valve is normal in structure. Aortic valve regurgitation is  not visualized. No aortic stenosis is present.  5. The inferior vena cava is normal in size with greater than 50%  respiratory variability, suggesting right atrial pressure of 3 mmHg.  Past Medical History:  Diagnosis Date  . Arthritis   . Asthma   . Bronchitis   . Chronic bronchitis (HCC)   . Flesh-eating bacteria (HCC)    from the ocean  . Foot fracture, left   . Hypertension   . Pneumonia    hx of   . PONV (postoperative nausea and vomiting)     Past Surgical History:  Procedure Laterality Date  . APPENDECTOMY    . left foot surgery     .  MANDIBLE SURGERY    . TONSILLECTOMY    . WISDOM TOOTH EXTRACTION      MEDICATIONS: . albuterol (VENTOLIN HFA) 108 (90 Base) MCG/ACT inhaler  . aspirin EC 81 MG tablet  . baclofen (LIORESAL) 10 MG tablet  . CALCIUM-VITAMIN D PO  . carvedilol (COREG) 6.25 MG tablet  . ferrous gluconate (FERGON) 324 MG tablet  . losartan (COZAAR) 25 MG tablet  . magnesium gluconate (MAGONATE) 500 MG tablet  . methocarbamol (ROBAXIN) 500 MG tablet  . Multiple Vitamins-Minerals (MULTIVITAMIN ADULT) CHEW  . naproxen sodium (ALEVE) 220 MG tablet  . nitroGLYCERIN (NITROSTAT) 0.4 MG SL tablet  . ondansetron (ZOFRAN) 4 MG tablet  . oxyCODONE-acetaminophen (PERCOCET) 5-325 MG tablet   No current facility-administered medications for this encounter.    Jodell Cipro,  PA-C [facility] Pre-Surgical Testing [phone]

## 2020-02-15 NOTE — Telephone Encounter (Signed)
-----   Message from Thomasene Ripple, DO sent at 02/15/2020  3:18 PM EST ----- Stress test normal

## 2020-02-15 NOTE — Anesthesia Preprocedure Evaluation (Addendum)
Anesthesia Evaluation  Patient identified by MRN, date of birth, ID band Patient awake    Reviewed: Allergy & Precautions, H&P , NPO status , Patient's Chart, lab work & pertinent test results  History of Anesthesia Complications (+) PONV and history of anesthetic complications  Airway Mallampati: II   Neck ROM: full    Dental   Pulmonary asthma ,    breath sounds clear to auscultation       Cardiovascular hypertension,  Rhythm:regular Rate:Normal     Neuro/Psych    GI/Hepatic   Endo/Other    Renal/GU      Musculoskeletal  (+) Arthritis ,   Abdominal   Peds  Hematology   Anesthesia Other Findings   Reproductive/Obstetrics                            Anesthesia Physical Anesthesia Plan  ASA: II  Anesthesia Plan: Spinal and MAC   Post-op Pain Management:    Induction: Intravenous  PONV Risk Score and Plan: 3 and Propofol infusion and Treatment may vary due to age or medical condition  Airway Management Planned: Simple Face Mask  Additional Equipment:   Intra-op Plan:   Post-operative Plan:   Informed Consent: I have reviewed the patients History and Physical, chart, labs and discussed the procedure including the risks, benefits and alternatives for the proposed anesthesia with the patient or authorized representative who has indicated his/her understanding and acceptance.       Plan Discussed with: CRNA, Anesthesiologist and Surgeon  Anesthesia Plan Comments: (See PAT note 02/15/2020, Konrad Felix, PA-C)       Anesthesia Quick Evaluation

## 2020-02-17 MED ORDER — TRANEXAMIC ACID 1000 MG/10ML IV SOLN
2000.0000 mg | INTRAVENOUS | Status: DC
  Filled 2020-02-17: qty 20

## 2020-02-17 MED ORDER — BUPIVACAINE LIPOSOME 1.3 % IJ SUSP
10.0000 mL | Freq: Once | INTRAMUSCULAR | Status: DC
  Filled 2020-02-17: qty 10

## 2020-02-18 ENCOUNTER — Observation Stay (HOSPITAL_COMMUNITY)
Admission: RE | Admit: 2020-02-18 | Discharge: 2020-02-19 | Disposition: A | Discharge status: Home / Self Care | Payer: Federal, State, Local not specified - PPO | Attending: Orthopaedic Surgery | Admitting: Orthopaedic Surgery

## 2020-02-18 ENCOUNTER — Ambulatory Visit (HOSPITAL_COMMUNITY): Payer: Federal, State, Local not specified - PPO | Admitting: Anesthesiology

## 2020-02-18 ENCOUNTER — Ambulatory Visit (HOSPITAL_COMMUNITY): Payer: Federal, State, Local not specified - PPO | Admitting: Physician Assistant

## 2020-02-18 ENCOUNTER — Encounter (HOSPITAL_COMMUNITY)
Admission: RE | Disposition: A | Discharge status: Home / Self Care | Payer: Self-pay | Source: Home / Self Care | Attending: Orthopaedic Surgery

## 2020-02-18 ENCOUNTER — Other Ambulatory Visit: Payer: Self-pay

## 2020-02-18 ENCOUNTER — Encounter (HOSPITAL_COMMUNITY): Payer: Self-pay | Admitting: Orthopaedic Surgery

## 2020-02-18 ENCOUNTER — Observation Stay (HOSPITAL_COMMUNITY): Payer: Federal, State, Local not specified - PPO

## 2020-02-18 ENCOUNTER — Ambulatory Visit (HOSPITAL_COMMUNITY): Payer: Federal, State, Local not specified - PPO

## 2020-02-18 DIAGNOSIS — J45909 Unspecified asthma, uncomplicated: Secondary | ICD-10-CM | POA: Insufficient documentation

## 2020-02-18 DIAGNOSIS — Z96642 Presence of left artificial hip joint: Secondary | ICD-10-CM

## 2020-02-18 DIAGNOSIS — Z7982 Long term (current) use of aspirin: Secondary | ICD-10-CM | POA: Diagnosis not present

## 2020-02-18 DIAGNOSIS — I10 Essential (primary) hypertension: Secondary | ICD-10-CM | POA: Insufficient documentation

## 2020-02-18 DIAGNOSIS — M1612 Unilateral primary osteoarthritis, left hip: Principal | ICD-10-CM | POA: Insufficient documentation

## 2020-02-18 DIAGNOSIS — Z419 Encounter for procedure for purposes other than remedying health state, unspecified: Secondary | ICD-10-CM

## 2020-02-18 DIAGNOSIS — Z96649 Presence of unspecified artificial hip joint: Secondary | ICD-10-CM

## 2020-02-18 DIAGNOSIS — M1712 Unilateral primary osteoarthritis, left knee: Secondary | ICD-10-CM | POA: Diagnosis not present

## 2020-02-18 DIAGNOSIS — J40 Bronchitis, not specified as acute or chronic: Secondary | ICD-10-CM | POA: Diagnosis not present

## 2020-02-18 DIAGNOSIS — Z471 Aftercare following joint replacement surgery: Secondary | ICD-10-CM | POA: Diagnosis not present

## 2020-02-18 HISTORY — DX: Presence of left artificial hip joint: Z96.642

## 2020-02-18 HISTORY — DX: Essential (primary) hypertension: I10

## 2020-02-18 HISTORY — PX: TOTAL HIP ARTHROPLASTY: SHX124

## 2020-02-18 LAB — TYPE AND SCREEN
ABO/RH(D): O POS
Antibody Screen: NEGATIVE

## 2020-02-18 LAB — ABO/RH: ABO/RH(D): O POS

## 2020-02-18 SURGERY — ARTHROPLASTY, HIP, TOTAL, ANTERIOR APPROACH
Anesthesia: Monitor Anesthesia Care | Site: Hip | Laterality: Left

## 2020-02-18 MED ORDER — SODIUM CHLORIDE (PF) 0.9 % IJ SOLN
INTRAMUSCULAR | Status: DC | PRN
  Administered 2020-02-18 (×2): 10 mL

## 2020-02-18 MED ORDER — PROPOFOL 1000 MG/100ML IV EMUL
INTRAVENOUS | Status: AC
  Filled 2020-02-18: qty 100

## 2020-02-18 MED ORDER — ALBUTEROL SULFATE HFA 108 (90 BASE) MCG/ACT IN AERS: 1.0000 | INHALATION_SPRAY | Freq: Four times a day (QID) | RESPIRATORY_TRACT | Status: DC | PRN

## 2020-02-18 MED ORDER — BUPIVACAINE HCL (PF) 0.25 % IJ SOLN
INTRAMUSCULAR | Status: DC | PRN
  Administered 2020-02-18: 20 mL

## 2020-02-18 MED ORDER — BUPIVACAINE IN DEXTROSE 0.75-8.25 % IT SOLN
INTRATHECAL | Status: DC | PRN
  Administered 2020-02-18: 1.8 mL via INTRATHECAL

## 2020-02-18 MED ORDER — METHOCARBAMOL 500 MG PO TABS: 500.0000 mg | ORAL_TABLET | Freq: Four times a day (QID) | ORAL | Status: DC | PRN

## 2020-02-18 MED ORDER — FERROUS GLUCONATE 324 (38 FE) MG PO TABS
324.0000 mg | ORAL_TABLET | Freq: Every day | ORAL | Status: DC
  Filled 2020-02-18: qty 1

## 2020-02-18 MED ORDER — SODIUM CHLORIDE 0.9 % IR SOLN
Status: DC | PRN
  Administered 2020-02-18: 3000 mL

## 2020-02-18 MED ORDER — FENTANYL CITRATE (PF) 100 MCG/2ML IJ SOLN: 25.0000 ug | INTRAMUSCULAR | Status: DC | PRN

## 2020-02-18 MED ORDER — POLYETHYLENE GLYCOL 3350 17 G PO PACK: 17.0000 g | PACK | Freq: Every day | ORAL | Status: DC | PRN

## 2020-02-18 MED ORDER — DOCUSATE SODIUM 100 MG PO CAPS
100.0000 mg | ORAL_CAPSULE | Freq: Two times a day (BID) | ORAL | Status: DC
  Administered 2020-02-18 – 2020-02-19 (×2): 100 mg via ORAL
  Filled 2020-02-18 (×2): qty 1

## 2020-02-18 MED ORDER — VANCOMYCIN HCL 1 G IV SOLR
INTRAVENOUS | Status: DC | PRN
  Administered 2020-02-18: 1000 mg via TOPICAL

## 2020-02-18 MED ORDER — OXYCODONE HCL 5 MG PO TABS
5.0000 mg | ORAL_TABLET | ORAL | Status: DC | PRN
  Administered 2020-02-18 – 2020-02-19 (×3): 10 mg via ORAL
  Filled 2020-02-18 (×2): qty 2

## 2020-02-18 MED ORDER — ONDANSETRON HCL 4 MG PO TABS: 4.0000 mg | ORAL_TABLET | Freq: Four times a day (QID) | ORAL | Status: DC | PRN

## 2020-02-18 MED ORDER — METOCLOPRAMIDE HCL 5 MG PO TABS
5.0000 mg | ORAL_TABLET | Freq: Three times a day (TID) | ORAL | Status: DC | PRN
  Administered 2020-02-18: 10 mg via ORAL
  Filled 2020-02-18: qty 2

## 2020-02-18 MED ORDER — ONDANSETRON HCL 4 MG/2ML IJ SOLN
INTRAMUSCULAR | Status: AC
  Filled 2020-02-18: qty 2

## 2020-02-18 MED ORDER — METOCLOPRAMIDE HCL 5 MG/ML IJ SOLN
5.0000 mg | Freq: Three times a day (TID) | INTRAMUSCULAR | Status: DC | PRN
Start: 2020-02-18 — End: 2020-02-19

## 2020-02-18 MED ORDER — LACTATED RINGERS IV SOLN: INTRAVENOUS | Status: DC

## 2020-02-18 MED ORDER — PROPOFOL 10 MG/ML IV BOLUS
INTRAVENOUS | Status: AC
  Filled 2020-02-18: qty 20

## 2020-02-18 MED ORDER — TRANEXAMIC ACID-NACL 1000-0.7 MG/100ML-% IV SOLN
1000.0000 mg | INTRAVENOUS | Status: AC
  Administered 2020-02-18: 08:00:00 1000 mg via INTRAVENOUS
  Filled 2020-02-18: qty 100

## 2020-02-18 MED ORDER — OXYCODONE HCL 5 MG/5ML PO SOLN: 5.0000 mg | Freq: Once | ORAL | Status: DC | PRN

## 2020-02-18 MED ORDER — MIDAZOLAM HCL 5 MG/5ML IJ SOLN
INTRAMUSCULAR | Status: DC | PRN
  Administered 2020-02-18: 2 mg via INTRAVENOUS

## 2020-02-18 MED ORDER — LIDOCAINE HCL (PF) 2 % IJ SOLN
INTRAMUSCULAR | Status: AC
  Filled 2020-02-18: qty 5

## 2020-02-18 MED ORDER — ASPIRIN 81 MG PO CHEW
81.0000 mg | CHEWABLE_TABLET | Freq: Two times a day (BID) | ORAL | Status: DC
  Administered 2020-02-18 – 2020-02-19 (×2): 81 mg via ORAL
  Filled 2020-02-18 (×2): qty 1

## 2020-02-18 MED ORDER — OXYCODONE HCL ER 10 MG PO T12A
10.0000 mg | EXTENDED_RELEASE_TABLET | Freq: Two times a day (BID) | ORAL | Status: DC
  Administered 2020-02-18 – 2020-02-19 (×3): 10 mg via ORAL
  Filled 2020-02-18 (×3): qty 1

## 2020-02-18 MED ORDER — SODIUM CHLORIDE (PF) 0.9 % IJ SOLN
INTRAMUSCULAR | Status: AC
  Filled 2020-02-18: qty 20

## 2020-02-18 MED ORDER — EPHEDRINE SULFATE-NACL 50-0.9 MG/10ML-% IV SOSY
PREFILLED_SYRINGE | INTRAVENOUS | Status: DC | PRN
  Administered 2020-02-18 (×6): 5 mg via INTRAVENOUS

## 2020-02-18 MED ORDER — HYDROMORPHONE HCL 1 MG/ML IJ SOLN: 0.5000 mg | INTRAMUSCULAR | Status: DC | PRN

## 2020-02-18 MED ORDER — TRANEXAMIC ACID-NACL 1000-0.7 MG/100ML-% IV SOLN
1000.0000 mg | Freq: Once | INTRAVENOUS | Status: AC
  Administered 2020-02-18: 13:00:00 1000 mg via INTRAVENOUS
  Filled 2020-02-18: qty 100

## 2020-02-18 MED ORDER — DEXAMETHASONE SODIUM PHOSPHATE 10 MG/ML IJ SOLN
10.0000 mg | Freq: Once | INTRAMUSCULAR | Status: AC
  Administered 2020-02-19: 09:00:00 10 mg via INTRAVENOUS
  Filled 2020-02-18: qty 1

## 2020-02-18 MED ORDER — BUPIVACAINE LIPOSOME 1.3 % IJ SUSP
INTRAMUSCULAR | Status: DC | PRN
  Administered 2020-02-18 (×2): 10 mL

## 2020-02-18 MED ORDER — SORBITOL 70 % SOLN
30.0000 mL | Freq: Every day | Status: DC | PRN
  Filled 2020-02-18: qty 30

## 2020-02-18 MED ORDER — MAGNESIUM CITRATE PO SOLN: 1.0000 | Freq: Once | ORAL | Status: DC | PRN

## 2020-02-18 MED ORDER — POVIDONE-IODINE 10 % EX SWAB
2.0000 "application " | Freq: Once | CUTANEOUS | Status: AC
  Administered 2020-02-18: 2 via TOPICAL

## 2020-02-18 MED ORDER — BUPIVACAINE LIPOSOME 1.3 % IJ SUSP
10.0000 mL | Freq: Once | INTRAMUSCULAR | Status: DC
  Filled 2020-02-18: qty 10

## 2020-02-18 MED ORDER — ONDANSETRON HCL 4 MG/2ML IJ SOLN: 4.0000 mg | Freq: Four times a day (QID) | INTRAMUSCULAR | Status: DC | PRN

## 2020-02-18 MED ORDER — ALUM & MAG HYDROXIDE-SIMETH 200-200-20 MG/5ML PO SUSP: 30.0000 mL | ORAL | Status: DC | PRN

## 2020-02-18 MED ORDER — MENTHOL 3 MG MT LOZG: 1.0000 | LOZENGE | OROMUCOSAL | Status: DC | PRN

## 2020-02-18 MED ORDER — METHOCARBAMOL 500 MG IVPB - SIMPLE MED
500.0000 mg | Freq: Four times a day (QID) | INTRAVENOUS | Status: DC | PRN
  Filled 2020-02-18: qty 50

## 2020-02-18 MED ORDER — PROPOFOL 10 MG/ML IV BOLUS
INTRAVENOUS | Status: DC | PRN
  Administered 2020-02-18 (×2): 50 mg via INTRAVENOUS

## 2020-02-18 MED ORDER — ACETAMINOPHEN 325 MG PO TABS: 325.0000 mg | ORAL_TABLET | Freq: Four times a day (QID) | ORAL | Status: DC | PRN

## 2020-02-18 MED ORDER — DEXAMETHASONE SODIUM PHOSPHATE 10 MG/ML IJ SOLN
INTRAMUSCULAR | Status: AC
  Filled 2020-02-18: qty 1

## 2020-02-18 MED ORDER — BACLOFEN 10 MG PO TABS: 10.0000 mg | ORAL_TABLET | Freq: Three times a day (TID) | ORAL | Status: DC | PRN

## 2020-02-18 MED ORDER — OXYCODONE HCL 5 MG PO TABS
10.0000 mg | ORAL_TABLET | ORAL | Status: DC | PRN
Start: 2020-02-18 — End: 2020-02-19
  Filled 2020-02-18: qty 2

## 2020-02-18 MED ORDER — PROPOFOL 500 MG/50ML IV EMUL
INTRAVENOUS | Status: DC | PRN
  Administered 2020-02-18: 25 ug/kg/min via INTRAVENOUS

## 2020-02-18 MED ORDER — CARVEDILOL 3.125 MG PO TABS
3.1250 mg | ORAL_TABLET | Freq: Two times a day (BID) | ORAL | Status: DC
  Administered 2020-02-18 – 2020-02-19 (×2): 3.125 mg via ORAL
  Filled 2020-02-18 (×2): qty 1

## 2020-02-18 MED ORDER — 0.9 % SODIUM CHLORIDE (POUR BTL) OPTIME
TOPICAL | Status: DC | PRN
  Administered 2020-02-18: 07:00:00 1000 mL

## 2020-02-18 MED ORDER — ONDANSETRON HCL 4 MG/2ML IJ SOLN
INTRAMUSCULAR | Status: DC | PRN
  Administered 2020-02-18: 4 mg via INTRAVENOUS

## 2020-02-18 MED ORDER — ONDANSETRON HCL 4 MG/2ML IJ SOLN
4.0000 mg | Freq: Four times a day (QID) | INTRAMUSCULAR | Status: DC | PRN
  Administered 2020-02-18 – 2020-02-19 (×3): 4 mg via INTRAVENOUS
  Filled 2020-02-18 (×3): qty 2

## 2020-02-18 MED ORDER — CEFAZOLIN SODIUM-DEXTROSE 2-4 GM/100ML-% IV SOLN
2.0000 g | Freq: Four times a day (QID) | INTRAVENOUS | Status: AC
Start: 2020-02-18 — End: 2020-02-19
  Administered 2020-02-18 (×2): 2 g via INTRAVENOUS
  Filled 2020-02-18 (×3): qty 100

## 2020-02-18 MED ORDER — EPHEDRINE 5 MG/ML INJ
INTRAVENOUS | Status: AC
  Filled 2020-02-18: qty 10

## 2020-02-18 MED ORDER — OXYCODONE HCL 5 MG PO TABS
5.0000 mg | ORAL_TABLET | Freq: Once | ORAL | Status: DC | PRN
Start: 2020-02-18 — End: 2020-02-18

## 2020-02-18 MED ORDER — VANCOMYCIN HCL 1000 MG IV SOLR
INTRAVENOUS | Status: AC
  Filled 2020-02-18: qty 1000

## 2020-02-18 MED ORDER — LOSARTAN POTASSIUM 25 MG PO TABS
25.0000 mg | ORAL_TABLET | Freq: Every day | ORAL | Status: DC
  Filled 2020-02-18: qty 1

## 2020-02-18 MED ORDER — KETOROLAC TROMETHAMINE 15 MG/ML IJ SOLN
15.0000 mg | Freq: Four times a day (QID) | INTRAMUSCULAR | Status: AC
  Administered 2020-02-18 – 2020-02-19 (×4): 15 mg via INTRAVENOUS
  Filled 2020-02-18 (×4): qty 1

## 2020-02-18 MED ORDER — PROPOFOL 500 MG/50ML IV EMUL
INTRAVENOUS | Status: AC
  Filled 2020-02-18: qty 50

## 2020-02-18 MED ORDER — FENTANYL CITRATE (PF) 100 MCG/2ML IJ SOLN
INTRAMUSCULAR | Status: DC | PRN
  Administered 2020-02-18: 50 ug via INTRAVENOUS

## 2020-02-18 MED ORDER — CEFAZOLIN SODIUM-DEXTROSE 2-4 GM/100ML-% IV SOLN
2.0000 g | INTRAVENOUS | Status: AC
  Administered 2020-02-18: 08:00:00 2 g via INTRAVENOUS
  Filled 2020-02-18: qty 100

## 2020-02-18 MED ORDER — MIDAZOLAM HCL 2 MG/2ML IJ SOLN
INTRAMUSCULAR | Status: AC
  Filled 2020-02-18: qty 2

## 2020-02-18 MED ORDER — CHLORHEXIDINE GLUCONATE 0.12 % MT SOLN: 15.0000 mL | Freq: Once | OROMUCOSAL | Status: AC

## 2020-02-18 MED ORDER — FENTANYL CITRATE (PF) 100 MCG/2ML IJ SOLN
INTRAMUSCULAR | Status: AC
  Filled 2020-02-18: qty 2

## 2020-02-18 MED ORDER — BUPIVACAINE HCL (PF) 0.25 % IJ SOLN
INTRAMUSCULAR | Status: AC
  Filled 2020-02-18: qty 30

## 2020-02-18 MED ORDER — DIPHENHYDRAMINE HCL 12.5 MG/5ML PO ELIX: 25.0000 mg | ORAL_SOLUTION | ORAL | Status: DC | PRN

## 2020-02-18 MED ORDER — ACETAMINOPHEN 500 MG PO TABS
1000.0000 mg | ORAL_TABLET | Freq: Four times a day (QID) | ORAL | Status: AC
  Administered 2020-02-18 – 2020-02-19 (×4): 1000 mg via ORAL
  Filled 2020-02-18 (×4): qty 2

## 2020-02-18 MED ORDER — TRANEXAMIC ACID 1000 MG/10ML IV SOLN
INTRAVENOUS | Status: DC | PRN
  Administered 2020-02-18: 07:00:00 2000 mg via TOPICAL

## 2020-02-18 MED ORDER — PHENOL 1.4 % MT LIQD: 1.0000 | OROMUCOSAL | Status: DC | PRN

## 2020-02-18 MED ORDER — SODIUM CHLORIDE 0.9 % IV SOLN: INTRAVENOUS | Status: DC

## 2020-02-18 MED ORDER — ORAL CARE MOUTH RINSE
15.0000 mL | Freq: Once | OROMUCOSAL | Status: AC
  Administered 2020-02-18: 06:00:00 15 mL via OROMUCOSAL

## 2020-02-18 MED ORDER — LIDOCAINE HCL (CARDIAC) PF 100 MG/5ML IV SOSY
PREFILLED_SYRINGE | INTRAVENOUS | Status: DC | PRN
  Administered 2020-02-18: 40 mg via INTRAVENOUS

## 2020-02-18 MED ORDER — DEXAMETHASONE SODIUM PHOSPHATE 4 MG/ML IJ SOLN
INTRAMUSCULAR | Status: DC | PRN
  Administered 2020-02-18: 4 mg via INTRAVENOUS

## 2020-02-18 MED ORDER — ACETAMINOPHEN 10 MG/ML IV SOLN
INTRAVENOUS | Status: AC
  Filled 2020-02-18: qty 100

## 2020-02-18 SURGICAL SUPPLY — 52 items
ACETAB CUP W GRIPTION 54MM (Plate) ×1 IMPLANT
ACETAB CUP W/GRIPTION 54 (Plate) ×2 IMPLANT
BAG ZIPLOCK 12X15 (MISCELLANEOUS) ×3 IMPLANT
CELLS DAT CNTRL 66122 CELL SVR (MISCELLANEOUS) ×1 IMPLANT
CLOSURE STERI-STRIP 1/2X4 (GAUZE/BANDAGES/DRESSINGS) ×1
CLSR STERI-STRIP ANTIMIC 1/2X4 (GAUZE/BANDAGES/DRESSINGS) ×2 IMPLANT
COVER PERINEAL POST (MISCELLANEOUS) ×3 IMPLANT
COVER SURGICAL LIGHT HANDLE (MISCELLANEOUS) ×3 IMPLANT
COVER WAND RF STERILE (DRAPES) IMPLANT
CUP ACETAB W/GRIPTION 54 (Plate) ×1 IMPLANT
DERMABOND ADVANCED (GAUZE/BANDAGES/DRESSINGS) ×2
DERMABOND ADVANCED .7 DNX12 (GAUZE/BANDAGES/DRESSINGS) ×1 IMPLANT
DRAPE IMP U-DRAPE 54X76 (DRAPES) ×6 IMPLANT
DRAPE POUCH INSTRU U-SHP 10X18 (DRAPES) ×3 IMPLANT
DRAPE STERI IOBAN 125X83 (DRAPES) ×6 IMPLANT
DRAPE U-SHAPE 47X51 STRL (DRAPES) ×6 IMPLANT
DRSG AQUACEL AG ADV 3.5X10 (GAUZE/BANDAGES/DRESSINGS) ×3 IMPLANT
DURAPREP 26ML APPLICATOR (WOUND CARE) ×6 IMPLANT
ELECT BLADE TIP CTD 4 INCH (ELECTRODE) ×3 IMPLANT
ELECT REM PT RETURN 15FT ADLT (MISCELLANEOUS) ×3 IMPLANT
GLOVE BIOGEL PI IND STRL 7.0 (GLOVE) ×1 IMPLANT
GLOVE BIOGEL PI INDICATOR 7.0 (GLOVE) ×2
GLOVE ECLIPSE 7.0 STRL STRAW (GLOVE) ×6 IMPLANT
GLOVE SURG SYN 7.5  E (GLOVE) ×10
GLOVE SURG SYN 7.5 E (GLOVE) ×5 IMPLANT
GOWN SRG XL LVL 4 BRTHBL STRL (GOWNS) ×2 IMPLANT
GOWN STRL NON-REIN XL LVL4 (GOWNS) ×4
HANDPIECE INTERPULSE COAX TIP (DISPOSABLE) ×2
HEAD CERAMIC DELTA 36 PLUS 1.5 (Hips) ×3 IMPLANT
HOOD PEEL AWAY FLYTE STAYCOOL (MISCELLANEOUS) ×9 IMPLANT
JET LAVAGE IRRISEPT WOUND (IRRIGATION / IRRIGATOR) ×3
KIT BASIN OR (CUSTOM PROCEDURE TRAY) ×3 IMPLANT
KIT TURNOVER KIT A (KITS) IMPLANT
LAVAGE JET IRRISEPT WOUND (IRRIGATION / IRRIGATOR) ×1 IMPLANT
LINER NEUTRAL 54X36MM PLUS 4 (Hips) ×3 IMPLANT
MARKER SKIN DUAL TIP RULER LAB (MISCELLANEOUS) ×6 IMPLANT
NEEDLE SPNL 18GX3.5 QUINCKE PK (NEEDLE) ×3 IMPLANT
PACK ANTERIOR HIP CUSTOM (KITS) ×3 IMPLANT
PENCIL SMOKE EVACUATOR (MISCELLANEOUS) IMPLANT
RTRCTR WOUND ALEXIS 18CM MED (MISCELLANEOUS) ×3
SAW OSC TIP CART 19.5X105X1.3 (SAW) ×3 IMPLANT
SCREW 6.5MMX25MM (Screw) ×3 IMPLANT
SET HNDPC FAN SPRY TIP SCT (DISPOSABLE) ×1 IMPLANT
STEM FEMORAL SZ 6MM STD ACTIS (Stem) ×3 IMPLANT
SUT ETHIBOND 2 V 37 (SUTURE) ×3 IMPLANT
SUT MNCRL AB 3-0 PS2 18 (SUTURE) IMPLANT
SUT VIC AB 0 CT1 36 (SUTURE) ×3 IMPLANT
SUT VIC AB 1 CTX 36 (SUTURE) ×2
SUT VIC AB 1 CTX36XBRD ANBCTR (SUTURE) ×1 IMPLANT
SUT VIC AB 2-0 CT1 27 (SUTURE) ×4
SUT VIC AB 2-0 CT1 TAPERPNT 27 (SUTURE) ×2 IMPLANT
TRAY FOLEY MTR SLVR 16FR STAT (SET/KITS/TRAYS/PACK) IMPLANT

## 2020-02-18 NOTE — Discharge Instructions (Signed)

## 2020-02-18 NOTE — Anesthesia Procedure Notes (Signed)
Spinal  Patient location during procedure: OR Start time: 02/18/2020 7:36 AM End time: 02/18/2020 7:38 AM Staffing Performed: anesthesiologist  Anesthesiologist: Achille Rich, MD Preanesthetic Checklist Completed: patient identified, IV checked, risks and benefits discussed, surgical consent, monitors and equipment checked, pre-op evaluation and timeout performed Spinal Block Patient position: sitting Prep: DuraPrep Patient monitoring: cardiac monitor, continuous pulse ox and blood pressure Approach: midline Location: L3-4 Injection technique: single-shot Needle Needle type: Pencan  Needle gauge: 24 G Needle length: 9 cm Assessment Sensory level: T10 Additional Notes Functioning IV was confirmed and monitors were applied. Sterile prep and drape, including hand hygiene and sterile gloves were used. The patient was positioned and the spine was prepped. The skin was anesthetized with lidocaine.  Free flow of clear CSF was obtained prior to injecting local anesthetic into the CSF.  The spinal needle aspirated freely following injection.  The needle was carefully withdrawn.  The patient tolerated the procedure well.

## 2020-02-18 NOTE — Anesthesia Procedure Notes (Signed)
Procedure Name: MAC Date/Time: 02/18/2020 7:32 AM Performed by: Michele Rockers, CRNA Pre-anesthesia Checklist: Patient identified, Emergency Drugs available, Suction available, Timeout performed and Patient being monitored Patient Re-evaluated:Patient Re-evaluated prior to induction Oxygen Delivery Method: Simple face mask

## 2020-02-18 NOTE — H&P (Signed)
PREOPERATIVE H&P  Chief Complaint: left hip degenerative joint disease  HPI: Toni Jones is a 59 y.o. female who presents for surgical treatment of left hip degenerative joint disease.  She denies any changes in medical history.  Past Medical History:  Diagnosis Date  . Arthritis   . Asthma   . Bronchitis   . Chronic bronchitis (HCC)   . Flesh-eating bacteria (HCC)    [place]  . Foot fracture, left   . Hypertension   . Pneumonia    hx of   . PONV (postoperative nausea and vomiting)    Past Surgical History:  Procedure Laterality Date  . APPENDECTOMY    . left foot surgery     . MANDIBLE SURGERY    . TONSILLECTOMY    . WISDOM TOOTH EXTRACTION     Social History   Socioeconomic History  . Marital status: Divorced    Spouse name: Not on file  . Number of children: Not on file  . Years of education: Not on file  . Highest education level: Not on file  Occupational History  . Not on file  Tobacco Use  . Smoking status: Never Smoker  . Smokeless tobacco: Never Used  Vaping Use  . Vaping Use: Never used  Substance and Sexual Activity  . Alcohol use: Never  . Drug use: Never  . Sexual activity: Not Currently    Partners: Male  Other Topics Concern  . Not on file  Social History Narrative  . Not on file   Social Determinants of Health   Financial Resource Strain: Not on file  Food Insecurity: Not on file  Transportation Needs: Not on file  Physical Activity: Not on file  Stress: Not on file  Social Connections: Not on file   Family History  Adopted: Yes  Problem Relation Age of Onset  . Heart attack Mother   . Cancer Son        osteogenic carcinoma   Allergies  Allergen Reactions  . Onion Other (See Comments)    Nose bleeds  . Red Dye #40 [Red Dye] Hives   Prior to Admission medications   Medication Sig Start Date End Date Taking? Authorizing Provider  baclofen (LIORESAL) 10 MG tablet Take 10 mg by mouth 3 (three) times daily as  needed for muscle spasms.   Yes [provider]  CALCIUM-VITAMIN D PO Take 2 each by mouth daily. Gummies   Yes [provider]  carvedilol (COREG) 6.25 MG tablet Take 0.5 tablets (3.125 mg total) by mouth 2 (two) times daily. 02/08/20  Yes Tobb, Kardie, DO  ferrous gluconate (FERGON) 324 MG tablet Take 1 tablet (324 mg total) by mouth daily with breakfast. 01/24/20  Yes Christen Butter, NP  losartan (COZAAR) 25 MG tablet Take 1 tablet (25 mg total) by mouth daily. 01/29/20  Yes Christen Butter, NP  magnesium gluconate (MAGONATE) 500 MG tablet Take 1,000 mg by mouth daily.   Yes [provider]  methocarbamol (ROBAXIN) 500 MG tablet Take 1 tablet (500 mg total) by mouth 2 (two) times daily as needed. To be taken post-op Patient taking differently: Take 500 mg by mouth 3 (three) times daily as needed for muscle spasms. To be taken post-op 02/11/20  Yes Cristie Hem, PA-C  Multiple Vitamins-Minerals (MULTIVITAMIN ADULT) CHEW Chew 2 each by mouth daily.   Yes [provider]  naproxen sodium (ALEVE) 220 MG tablet Take 440 mg by mouth daily as needed (pain).  Yes [provider]  nitroGLYCERIN (NITROSTAT) 0.4 MG SL tablet Place 1 tablet (0.4 mg total) under the tongue every 5 (five) minutes as needed for chest pain. 02/08/20 05/08/20 Yes Tobb, Kardie, DO  albuterol (VENTOLIN HFA) 108 (90 Base) MCG/ACT inhaler Inhale 1-2 puffs into the lungs every 6 (six) hours as needed for wheezing or shortness of breath. 08/01/18   Jacalyn Lefevre, MD  aspirin EC 81 MG tablet Take 1 tablet (81 mg total) by mouth in the morning and at bedtime. To be taken post-op 02/11/20   Cristie Hem, PA-C  ondansetron (ZOFRAN) 4 MG tablet Take 1 tablet (4 mg total) by mouth every 8 (eight) hours as needed for nausea or vomiting. 02/11/20   Cristie Hem, PA-C  oxyCODONE-acetaminophen (PERCOCET) 5-325 MG tablet Take 1-2 tablets by mouth every 6 (six) hours as needed. To be taken post-op 02/11/20    Cristie Hem, PA-C     Positive ROS: All other systems have been reviewed and were otherwise negative with the exception of those mentioned in the HPI and as above.  Physical Exam: General: Alert, no acute distress Cardiovascular: No pedal edema Respiratory: No cyanosis, no use of accessory musculature GI: abdomen soft Skin: No lesions in the area of chief complaint Neurologic: Sensation intact distally Psychiatric: Patient is competent for consent with normal mood and affect Lymphatic: no lymphedema  MUSCULOSKELETAL: exam stable  Assessment: left hip degenerative joint disease  Plan: Plan for Procedure(s): LEFT TOTAL HIP ARTHROPLASTY ANTERIOR APPROACH  The risks benefits and alternatives were discussed with the patient including but not limited to the risks of nonoperative treatment, versus surgical intervention including infection, bleeding, nerve injury,  blood clots, cardiopulmonary complications, morbidity, mortality, among others, and they were willing to proceed.   Preoperative templating of the joint replacement has been completed, documented, and submitted to the Operating Room personnel in order to optimize intra-operative equipment management.   Glee Arvin, MD 02/18/2020 6:03 AM

## 2020-02-18 NOTE — Transfer of Care (Signed)
Immediate Anesthesia Transfer of Care Note  Patient: Toni Jones  Procedure(s) Performed: LEFT TOTAL HIP ARTHROPLASTY ANTERIOR APPROACH (Left Hip)  Patient Location: PACU  Anesthesia Type:Spinal  Level of Consciousness: awake, alert  and oriented  Airway & Oxygen Therapy: Patient Spontanous Breathing and Patient connected to face mask oxygen  Post-op Assessment: Report given to RN and Post -op Vital signs reviewed and stable  Post vital signs: Reviewed and stable  Last Vitals:  Vitals Value Taken Time  BP    Temp    Pulse 57 02/18/20 0922  Resp 12 02/18/20 0922  SpO2 98 % 02/18/20 0922  Vitals shown include unvalidated device data.  Last Pain:  Vitals:   02/18/20 0546  TempSrc:   PainSc: 4       Patients Stated Pain Goal: 4 (02/18/20 0546)  Complications: No complications documented.

## 2020-02-18 NOTE — Evaluation (Signed)
Physical Therapy Evaluation Patient Details Name: Toni Jones MRN: [id number] DOB: 08/03/1960 Today's Date: 02/18/2020   History of Present Illness  Patient is 59 y.o. female s/p Lt THA anterior approach on 02/18/20 with PMH significant for HTN, asthma, OA.  Clinical Impression  Toni Jones is a 59 y.o. female POD 0 s/p Lt THA. Patient reports independence with mobility at baseline. Patient is now limited by functional impairments (see PT problem list below) and requires min assist/guard for transfers and gait with RW. Patient was able to ambulate ~75 feet with RW and min assist. Patient instructed in exercise to facilitate strength and circulation. Patient will benefit from continued skilled PT interventions to address impairments and progress towards PLOF. Acute PT will follow to progress mobility and stair training in preparation for safe discharge home.     Follow Up Recommendations Follow surgeon's recommendation for DC plan and follow-up therapies;Home health PT    Equipment Recommendations  Rolling walker with 5" wheels    Recommendations for Other Services       Precautions / Restrictions Precautions Precautions: Fall Restrictions Weight Bearing Restrictions: No Other Position/Activity Restrictions: WBAT      Mobility  Bed Mobility Overal bed mobility: Needs Assistance Bed Mobility: Supine to Sit     Supine to sit: HOB elevated;Min assist     General bed mobility comments: cues to use bed rail and light assist at Lt LE to move to EOB.    Transfers Overall transfer level: Needs assistance Equipment used: Rolling walker (2 wheeled) Transfers: Sit to/from Stand Sit to Stand: Min assist         General transfer comment: cues for technique for power up to RW, light assist to rise. pt steady in standing  Ambulation/Gait Ambulation/Gait assistance: Min assist;Min guard Gait Distance (Feet): 75 Feet Assistive device: Rolling walker (2 wheeled) Gait  Pattern/deviations: Step-to pattern;Decreased stride length;Decreased weight shift to left Gait velocity: decr   General Gait Details: pt with slow gait, maintained safe proximity to RW, pt taking short steps and min assist-gaurd for safety with RW management. no overt LOB.  Stairs            Wheelchair Mobility    Modified Rankin (Stroke Patients Only)       Balance Overall balance assessment: Needs assistance Sitting-balance support: Feet supported Sitting balance-Leahy Scale: Good     Standing balance support: During functional activity;Bilateral upper extremity supported Standing balance-Leahy Scale: Fair                               Pertinent Vitals/Pain Pain Assessment: 0-10 Pain Score: 4  Pain Location: Lt hip Pain Descriptors / Indicators: Burning;Aching Pain Intervention(s): Monitored during session;Limited activity within patient's tolerance;Repositioned;Ice applied    Home Living Family/patient expects to be discharged to:: Private residence Living Arrangements: Non-relatives/Friends;Other relatives Available Help at Discharge: Friend(s) Type of Home: House Home Access: Level entry     Home Layout: One level Home Equipment: Grab bars - tub/shower;Shower seat - built in;Cane - single point Additional Comments: pt will discharge to stay in basement apartament at a friends home    Prior Function Level of Independence: Independent         Comments: enjoys martial arts, biking     Higher education careers adviser        Extremity/Trunk Assessment   Upper Extremity Assessment Upper Extremity Assessment: Overall WFL for tasks assessed    Lower Extremity Assessment Lower Extremity  Assessment: Overall WFL for tasks assessed    Cervical / Trunk Assessment Cervical / Trunk Assessment: Normal  Communication   Communication: No difficulties  Cognition Arousal/Alertness: Awake/alert Behavior During Therapy: WFL for tasks assessed/performed Overall  Cognitive Status: Within Functional Limits for tasks assessed                                        General Comments      Exercises Total Joint Exercises Ankle Circles/Pumps: AROM;Both;20 reps;Seated Quad Sets: AROM;10 reps;Left;Seated   Assessment/Plan    PT Assessment Patient needs continued PT services  PT Problem List Decreased range of motion;Decreased strength;Decreased activity tolerance;Decreased balance;Decreased mobility;Decreased knowledge of use of DME;Decreased knowledge of precautions       PT Treatment Interventions DME instruction;Gait training;Stair training;Functional mobility training;Therapeutic activities;Therapeutic exercise;Balance training;Patient/family education    PT Goals (Current goals can be found in the Care Plan section)  Acute Rehab PT Goals Patient Stated Goal: recover independence PT Goal Formulation: With patient Time For Goal Achievement: 02/18/20 Potential to Achieve Goals: Good    Frequency 7X/week   Barriers to discharge        Co-evaluation               AM-PAC PT "6 Clicks" Mobility  Outcome Measure Help needed turning from your back to your side while in a flat bed without using bedrails?: None Help needed moving from lying on your back to sitting on the side of a flat bed without using bedrails?: A Little Help needed moving to and from a bed to a chair (including a wheelchair)?: A Little Help needed standing up from a chair using your arms (e.g., wheelchair or bedside chair)?: A Little Help needed to walk in hospital room?: A Little   6 Click Score: 16    End of Session Equipment Utilized During Treatment: Gait belt Activity Tolerance: Patient tolerated treatment well Patient left: in chair;with call bell/phone within reach;with chair alarm set Nurse Communication: Mobility status PT Visit Diagnosis: Muscle weakness (generalized) (M62.81);Difficulty in walking, not elsewhere classified (R26.2)     Time: 8299-3716 PT Time Calculation (min) (ACUTE ONLY): 30 min   Charges:   PT Evaluation $PT Eval Low Complexity: 1 Low PT Treatments $Gait Training: 8-22 mins        Wynn Maudlin, DPT Acute Rehabilitation Services  Office [phone] Pager [phone]  02/18/2020 2:53 PM

## 2020-02-18 NOTE — Plan of Care (Signed)

## 2020-02-18 NOTE — Op Note (Signed)
LEFT TOTAL HIP ARTHROPLASTY ANTERIOR APPROACH  Procedure Note Vanette Noguchi   [id number]  Pre-op Diagnosis: left hip degenerative joint disease     Post-op Diagnosis: same   Operative Procedures  1. Total hip replacement; Left hip; uncemented [id number]   Personnel  Surgeon(s): Tarry Kos, MD  Assist: Oneal Grout, PA-C; necessary for the timely completion of procedure and due to complexity of procedure.   Anesthesia: spinal, exparel local  Prosthesis: Depuy Acetabulum: Pinnacle 54 mm Femur: Actis 6 STD Head: 36 mm size: +1.5 Liner: +4 Bearing Type: ceramic on poly  Total Hip Arthroplasty (Anterior Approach) Op Note:  After informed consent was obtained and the operative extremity marked in the holding area, the patient was brought back to the operating room and placed supine on the HANA table. Next, the operative extremity was prepped and draped in normal sterile fashion. Surgical timeout occurred verifying patient identification, surgical site, surgical procedure and administration of antibiotics.  A modified anterior Smith-Peterson approach to the hip was performed, using the interval between tensor fascia lata and sartorius.  Dissection was carried bluntly down onto the anterior hip capsule. The lateral femoral circumflex vessels were identified and coagulated. A capsulotomy was performed and the capsular flaps tagged for later repair.  The neck osteotomy was performed. The femoral head was removed which showed complete eburnation of the cartilage, the acetabular rim was cleared of soft tissue and attention was turned to reaming the acetabulum.  Sequential reaming was performed under fluoroscopic guidance. We reamed to a size 53 mm, and then impacted the acetabular shell. A 25 mm cancellous screw was placed through the shell for added fixation.  The liner was then placed after irrigation and attention turned to the femur.  After placing the femoral hook, the leg was  taken to externally rotated, extended and adducted position taking care to perform soft tissue releases to allow for adequate mobilization of the femur. Soft tissue was cleared from the shoulder of the greater trochanter and the hook elevator used to improve exposure of the proximal femur. Sequential broaching performed up to a size 6. Trial neck and head were placed. The leg was brought back up to neutral and the construct reduced.  Antibiotic irrigation was placed in the surgical wound and kept for at least 1 minute.  The position and sizing of components, offset and leg lengths were checked using fluoroscopy. Stability of the construct was checked in extension and external rotation without any subluxation or impingement of prosthesis. We dislocated the prosthesis, dropped the leg back into position, removed trial components, and irrigated copiously. The final stem and head was then placed, the leg brought back up, the system reduced and fluoroscopy used to verify positioning.  We irrigated, obtained hemostasis and closed the capsule using #2 ethibond suture.  One gram of vancomycin powder was placed in the surgical bed. A dilute solution of 20 cc of normal saline, 1.3% exparel, 0.25% bupivacaine was injected in the soft tissues.  One gram of topical tranexamic acid was injected into the joint.  The fascia was closed with #1 vicryl plus, the deep fat layer was closed with 0 vicryl, the subcutaneous layers closed with 2.0 Vicryl Plus and the skin closed with 2.0 nylon and dermabond. A sterile dressing was applied. The patient was awakened in the operating room and taken to recovery in stable condition.  All sponge, needle, and instrument counts were correct at the end of the case.   Position: supine  Complications:  see description of procedure.  Time Out: performed   Drains/Packing: none  Estimated blood loss: see anesthesia record  Returned to Recovery Room: in good condition.   Antibiotics: yes    Mechanical VTE (DVT) Prophylaxis: sequential compression devices, TED thigh-high  Chemical VTE (DVT) Prophylaxis: aspirin   Fluid Replacement: see anesthesia record  Specimens Removed: 1 to pathology   Sponge and Instrument Count Correct? yes   PACU: portable radiograph - low AP   Plan/RTC: Return in 2 weeks for staple removal. Weight Bearing/Load Lower Extremity: full  Hip precautions: none Suture Removal: 2 weeks   N. Glee Arvin, MD [facility] 8:50 AM   Implant Name Type Inv. Item Serial No. Manufacturer Lot No. LRB No. Used Action  ACETAB CUP Cathe Mons - [id number] Plate ACETAB CUP W GRIPTION  [employer] [id number] Left 1 Implanted  LINER NEUTRAL 54X36MM PLUS 4 - [id number] Hips LINER NEUTRAL 54X36MM PLUS 4  [employer] [id number] Left 1 Implanted  SCREW 6.5MMX25MM - [id number] Screw SCREW 6.5MMX25MM  [employer] [id number] Left 1 Implanted  STEM FEMORAL SZ STD ACTIS - [id number] Stem STEM FEMORAL SZ STD ACTIS  [employer] [id number] Left 1 Implanted  HEAD CERAMIC DELTA 36 PLUS 1.5 - [id number] Hips HEAD CERAMIC DELTA 36 PLUS 1.5  [employer] [id number] Left 1 Implanted

## 2020-02-19 ENCOUNTER — Encounter (HOSPITAL_COMMUNITY): Payer: Self-pay | Admitting: Orthopaedic Surgery

## 2020-02-19 DIAGNOSIS — I10 Essential (primary) hypertension: Secondary | ICD-10-CM | POA: Diagnosis not present

## 2020-02-19 DIAGNOSIS — M1612 Unilateral primary osteoarthritis, left hip: Secondary | ICD-10-CM | POA: Diagnosis not present

## 2020-02-19 DIAGNOSIS — Z7982 Long term (current) use of aspirin: Secondary | ICD-10-CM | POA: Diagnosis not present

## 2020-02-19 DIAGNOSIS — J45909 Unspecified asthma, uncomplicated: Secondary | ICD-10-CM | POA: Diagnosis not present

## 2020-02-19 LAB — BASIC METABOLIC PANEL
Anion gap: 10 (ref 5–15)
BUN: 21 mg/dL — ABNORMAL HIGH (ref 6–20)
CO2: 22 mmol/L (ref 22–32)
Calcium: 8.6 mg/dL — ABNORMAL LOW (ref 8.9–10.3)
Chloride: 106 mmol/L (ref 98–111)
Creatinine, Ser: 0.9 mg/dL (ref 0.44–1.00)
GFR, Estimated: >60 mL/min (ref >60)
Glucose, Bld: 138 mg/dL — ABNORMAL HIGH (ref 70–99)
Potassium: 4.4 mmol/L (ref 3.5–5.1)
Sodium: 138 mmol/L (ref 135–145)

## 2020-02-19 LAB — CBC
HCT: 28.7 % — ABNORMAL LOW (ref 36.0–46.0)
Hemoglobin: 8.7 g/dL — ABNORMAL LOW (ref 12.0–15.0)
MCH: 21.9 pg — ABNORMAL LOW (ref 26.0–34.0)
MCHC: 30.3 g/dL (ref 30.0–36.0)
MCV: 72.3 fL — ABNORMAL LOW (ref 80.0–100.0)
Platelets: 359 10*3/uL (ref 150–400)
RBC: 3.97 MIL/uL (ref 3.87–5.11)
RDW: 17.9 % — ABNORMAL HIGH (ref 11.5–15.5)
WBC: 14.7 10*3/uL — ABNORMAL HIGH (ref 4.0–10.5)
nRBC: 0 % (ref 0.0–0.2)

## 2020-02-19 MED ORDER — CHLORHEXIDINE GLUCONATE CLOTH 2 % EX PADS: 6.0000 | MEDICATED_PAD | Freq: Every day | CUTANEOUS | Status: DC

## 2020-02-19 NOTE — Progress Notes (Addendum)
Subjective: 1 Day Post-Op Procedure(s) (LRB): LEFT TOTAL HIP ARTHROPLASTY ANTERIOR APPROACH (Left) Patient reports pain as mild.    Objective: Vital signs in last 24 hours: Temp:  [96.2 F (35.7 C)-98.5 F (36.9 C)] 98.2 F (36.8 C) (12/14 0516) Pulse Rate:  [47-78] 66 (12/14 0516) Resp:  [8-20] 15 (12/14 0516) BP: (105-132)/(54-82) 117/62 (12/14 0516) SpO2:  [91 %-100 %] 91 % (12/14 0516)  Intake/Output from previous day: 12/13 0701 - 12/14 0700 In: 1116.3 [I.V.:916.3; IV Piggyback:200] Out: 2550 [Urine:2400; Blood:150] Intake/Output this shift: No intake/output data recorded.  Recent Labs    02/19/20 0302  HGB 8.7*   Recent Labs    02/19/20 0302  WBC 14.7*  RBC 3.97  HCT 28.7*  PLT 359   Recent Labs    02/19/20 0302  NA 138  K 4.4  CL 106  CO2 22  BUN 21*  CREATININE 0.90  GLUCOSE 138*  CALCIUM 8.6*   No results for input(s): LABPT, INR in the last 72 hours.  Neurologically intact Neurovascular intact Sensation intact distally Intact pulses distally Dorsiflexion/Plantar flexion intact Incision: dressing C/D/I No cellulitis present Compartment soft   Assessment/Plan: 1 Day Post-Op Procedure(s) (LRB): LEFT TOTAL HIP ARTHROPLASTY ANTERIOR APPROACH (Left) Advance diet Up with therapy D/C IV fluids Discharge home with home health after first or second PT session (depending on progression and patient comfort) WBAT LLE ABLA- mild and stable.  On iron pills for anemia, and will continue once d/c Post-op meds sent in last week     Cristie Hem 02/19/2020, 7:42 AM

## 2020-02-19 NOTE — Progress Notes (Signed)
RN reviewed discharge instructions with patient and family. All questions answered.   Paperwork given. Prescriptions electronically sent to patient pharmacy.    NT rolled patient down with all belongings to family car.     Judiann Celia, RN  

## 2020-02-19 NOTE — Discharge Summary (Signed)
Patient ID: Toni Jones MRN: [id number] DOB/AGE: 03/08/1961 59 y.o.  Admit date: 02/18/2020 Discharge date: 02/19/2020  Admission Diagnoses:  Principal Problem:   Primary osteoarthritis of left hip Active Problems:   Status post total replacement of left hip   Discharge Diagnoses:  Same  Past Medical History:  Diagnosis Date  . Arthritis   . Asthma   . Bronchitis   . Chronic bronchitis (HCC)   . Flesh-eating bacteria (HCC)    from the ocean  . Foot fracture, left   . Hypertension   . Pneumonia    hx of   . PONV (postoperative nausea and vomiting)     Surgeries: Procedure(s): LEFT TOTAL HIP ARTHROPLASTY ANTERIOR APPROACH on 02/18/2020   Consultants:   Discharged Condition: Improved  Hospital Course: Toni GalaLeslie Posthumus is an [age] y.o. female who was admitted 02/18/2020 for operative treatment ofPrimary osteoarthritis of left hip. Patient has severe unremitting pain that affects sleep, daily activities, and work/hobbies. After pre-op clearance the patient was taken to the operating room on 02/18/2020 and underwent  Procedure(s): LEFT TOTAL HIP ARTHROPLASTY ANTERIOR APPROACH.    Patient was given perioperative antibiotics:  Anti-infectives (From admission, onward)   Start     Dose/Rate Route Frequency Ordered Stop   02/18/20 1400  ceFAZolin (ANCEF) IVPB 2g/100 mL premix        2 g 200 mL/hr over 30 Minutes Intravenous Every 6 hours 02/18/20 1117 02/19/20 0614   02/18/20 0723  vancomycin (VANCOCIN) powder  Status:  Discontinued          As needed 02/18/20 0723 02/18/20 1115   02/18/20 0600  ceFAZolin (ANCEF) IVPB 2g/100 mL premix        2 g 200 mL/hr over 30 Minutes Intravenous On call to O.R. 02/18/20 0530 02/18/20 0750       Patient was given sequential compression devices, early ambulation, and chemoprophylaxis to prevent DVT.  Patient benefited maximally from hospital stay and there were no complications.    Recent vital signs:  Patient Vitals for the past 24  hrs:  BP Temp Temp src Pulse Resp SpO2  02/19/20 0516 117/62 98.2 F (36.8 C) -- 66 15 91 %  02/19/20 0128 (!) 124/58 98.4 F (36.9 C) Oral 69 16 93 %  02/18/20 2129 (!) 123/57 98.3 F (36.8 C) -- 69 18 92 %  02/18/20 1832 (!) 114/57 (!) 97.5 F (36.4 C) Oral 66 16 92 %  02/18/20 1449 132/66 (!) 97.5 F (36.4 C) Oral 78 16 94 %  02/18/20 1326 (!) 126/58 (!) 97.5 F (36.4 C) Oral (!) 59 17 99 %  02/18/20 1233 132/67 98.5 F (36.9 C) Oral 62 20 100 %  02/18/20 1129 125/65 (!) 97.5 F (36.4 C) Oral 62 15 100 %  02/18/20 1100 105/82 (!) 97.5 F (36.4 C) -- (!) 55 12 98 %  02/18/20 1045 107/61 -- -- (!) 50 12 99 %  02/18/20 1030 107/63 (!) 97.5 F (36.4 C) -- (!) 47 15 99 %  02/18/20 1015 110/69 97.6 F (36.4 C) -- (!) 48 11 98 %  02/18/20 1000 114/62 (!) 96.2 F (35.7 C) -- (!) 47 13 98 %  02/18/20 0945 112/60 -- -- (!) 50 (!) 8 100 %  02/18/20 0930 106/72 -- -- (!) 57 11 100 %  02/18/20 0921 (!) 107/54 (!) 96.4 F (35.8 C) -- (!) 57 12 98 %     Recent laboratory studies:  Recent Labs    02/19/20 0302  WBC  14.7*  HGB 8.7*  HCT 28.7*  PLT 359  NA 138  K 4.4  CL 106  CO2 22  BUN 21*  CREATININE 0.90  GLUCOSE 138*  CALCIUM 8.6*     Discharge Medications:   Allergies as of 02/19/2020      Reactions   Onion Other (See Comments)   Nose bleeds   Red Dye #40 [red Dye] Hives      Medication List    STOP taking these medications   naproxen sodium 220 MG tablet Commonly known as: ALEVE     TAKE these medications   albuterol 108 (90 Base) MCG/ACT inhaler Commonly known as: VENTOLIN HFA Inhale 1-2 puffs into the lungs every 6 (six) hours as needed for wheezing or shortness of breath.   aspirin EC 81 MG tablet Take 1 tablet (81 mg total) by mouth in the morning and at bedtime. To be taken post-op   baclofen 10 MG tablet Commonly known as: LIORESAL Take 10 mg by mouth 3 (three) times daily as needed for muscle spasms.   CALCIUM-VITAMIN D PO Take 2 each by  mouth daily. Gummies   carvedilol 6.25 MG tablet Commonly known as: COREG Take 0.5 tablets (3.125 mg total) by mouth 2 (two) times daily.   ferrous gluconate 324 MG tablet Commonly known as: FERGON Take 1 tablet (324 mg total) by mouth daily with breakfast.   losartan 25 MG tablet Commonly known as: COZAAR Take 1 tablet (25 mg total) by mouth daily.   magnesium gluconate 500 MG tablet Commonly known as: MAGONATE Take 1,000 mg by mouth daily.   methocarbamol 500 MG tablet Commonly known as: Robaxin Take 1 tablet (500 mg total) by mouth 2 (two) times daily as needed. To be taken post-op What changed:   when to take this  reasons to take this   Multivitamin Adult Chew Chew 2 each by mouth daily.   nitroGLYCERIN 0.4 MG SL tablet Commonly known as: NITROSTAT Place 1 tablet (0.4 mg total) under the tongue every 5 (five) minutes as needed for chest pain.   ondansetron 4 MG tablet Commonly known as: Zofran Take 1 tablet (4 mg total) by mouth every 8 (eight) hours as needed for nausea or vomiting.   oxyCODONE-acetaminophen 5-325 MG tablet Commonly known as: Percocet Take 1-2 tablets by mouth every 6 (six) hours as needed. To be taken post-op            Durable Medical Equipment  (From admission, onward)         Start     Ordered   02/18/20 1117  DME Walker rolling  Once       Question:  Patient needs a walker to treat with the following condition  Answer:  History of hip replacement   02/18/20 1117   02/18/20 1117  DME 3 n 1  Once        02/18/20 1117   02/18/20 1117  DME Bedside commode  Once       Question:  Patient needs a bedside commode to treat with the following condition  Answer:  History of hip replacement   02/18/20 1117          Diagnostic Studies: DG Chest 2 View  Result Date: 02/15/2020 CLINICAL DATA:  Preoperative respiratory exam for left hip arthroplasty. History of asthma and hypertension. EXAM: CHEST - 2 VIEW COMPARISON:  10/09/2019  FINDINGS: Heart size is normal. Mild atherosclerotic calcification of the aorta. Lungs are clear. The vascularity is  normal. No effusions. No significant bone finding. IMPRESSION: No active disease. Mild aortic atherosclerosis. Electronically Signed   By: Paulina Fusi M.D.   On: 02/15/2020 15:21   DG Pelvis Portable  Result Date: 02/18/2020 CLINICAL DATA:  59 year old female status post left hip replacement. EXAM: PORTABLE PELVIS 1-2 VIEWS COMPARISON:  Intraoperative images 0751 hours today. Left hip series 10/09/2019. FINDINGS: Portable AP view at 0952 hours. Left total hip arthroplasty in place. Hardware appears intact with normal AP alignment. No unexpected osseous changes. Mild regional postoperative soft tissue gas. Pelvis appears intact. Stable proximal right femur. Paucity of bowel gas. IMPRESSION: Left total hip arthroplasty with no adverse features. Electronically Signed   By: Odessa Fleming M.D.   On: 02/18/2020 10:05   DG C-Arm 1-60 Min-No Report  Result Date: 02/18/2020 Fluoroscopy was utilized by the requesting physician.  No radiographic interpretation.   MYOCARDIAL PERFUSION IMAGING  Result Date: 02/14/2020  The left ventricular ejection fraction is hyperdynamic (>65%).  Nuclear stress EF: 67%.  There was no ST segment deviation noted during stress.  The study is normal.  This is a low risk study.  No ischemia or infarction on perfusion images.   ECHOCARDIOGRAM COMPLETE  Result Date: 02/12/2020    ECHOCARDIOGRAM REPORT   Patient Name:   THUY ATILANO Date of Exam: 02/12/2020 Medical Rec #:  [id number]      Height:       65.0 in Accession #:    [id number]     Weight:       216.0 lb Date of Birth:  March 21, 1960      BSA:          2.044 m Patient Age:    59 years       BP:           124/59 mmHg Patient Gender: F              HR:           57 bpm. Exam Location:  Outpatient Procedure: 2D Echo, Cardiac Doppler and Color Doppler Indications:    Pre-op evaluation, murmur  History:        Patient  has no prior history of Echocardiogram examinations.                 Arrythmias:Atrial tachycardia, Signs/Symptoms:Murmur; Risk                 Factors:Hypertension and oBESITY. Pre-op.  Sonographer:    Lavenia Atlas Referring Phys: [id number] KARDIE TOBB  Sonographer Comments: Patient is morbidly obese. IMPRESSIONS  1. Left ventricular ejection fraction, by estimation, is 60 to 65%. The left ventricle has normal function. The left ventricle has no regional wall motion abnormalities. Left ventricular diastolic parameters were normal.  2. Right ventricular systolic function is normal. The right ventricular size is normal.  3. The mitral valve is normal in structure. Trivial mitral valve regurgitation. No evidence of mitral stenosis.  4. The aortic valve is normal in structure. Aortic valve regurgitation is not visualized. No aortic stenosis is present.  5. The inferior vena cava is normal in size with greater than 50% respiratory variability, suggesting right atrial pressure of 3 mmHg. FINDINGS  Left Ventricle: Left ventricular ejection fraction, by estimation, is 60 to 65%. The left ventricle has normal function. The left ventricle has no regional wall motion abnormalities. The left ventricular internal cavity size was normal in size. There is  borderline concentric left ventricular hypertrophy. Left ventricular diastolic parameters were normal.  Right Ventricle: The right ventricular size is normal. No increase in right ventricular wall thickness. Right ventricular systolic function is normal. Left Atrium: Left atrial size was normal in size. Right Atrium: Right atrial size was normal in size. Pericardium: There is no evidence of pericardial effusion. Mitral Valve: The mitral valve is normal in structure. Trivial mitral valve regurgitation. No evidence of mitral valve stenosis. Tricuspid Valve: The tricuspid valve is normal in structure. Tricuspid valve regurgitation is mild . No evidence of tricuspid stenosis.  Aortic Valve: The aortic valve is normal in structure. Aortic valve regurgitation is not visualized. No aortic stenosis is present. Pulmonic Valve: The pulmonic valve was normal in structure. Pulmonic valve regurgitation is not visualized. No evidence of pulmonic stenosis. Aorta: The aortic root is normal in size and structure. Venous: The inferior vena cava is normal in size with greater than 50% respiratory variability, suggesting right atrial pressure of 3 mmHg. IAS/Shunts: No atrial level shunt detected by color flow Doppler.  LEFT VENTRICLE PLAX 2D LVIDd:         4.80 cm  Diastology LVIDs:         3.20 cm  LV e' medial:    8.49 cm/s LV PW:         1.00 cm  LV E/e' medial:  9.9 LV IVS:        1.00 cm  LV e' lateral:   8.38 cm/s LVOT diam:     2.00 cm  LV E/e' lateral: 10.0 LV SV:         91 LV SV Index:   45 LVOT Area:     3.14 cm  RIGHT VENTRICLE RV Basal diam:  3.20 cm RV S prime:     12.00 cm/s TAPSE (M-mode): 2.6 cm LEFT ATRIUM             Index       RIGHT ATRIUM           Index LA diam:        3.30 cm 1.61 cm/m  RA Area:     13.40 cm LA Vol (A2C):   62.9 ml 30.77 ml/m RA Volume:   30.10 ml  14.73 ml/m LA Vol (A4C):   44.4 ml 21.72 ml/m LA Biplane Vol: 52.8 ml 25.83 ml/m  AORTIC VALVE LVOT Vmax:   123.00 cm/s LVOT Vmean:  93.000 cm/s LVOT VTI:    0.290 m  AORTA Ao Root diam: 2.70 cm MITRAL VALVE MV Area (PHT): 5.38 cm    SHUNTS MV Decel Time: 141 msec    Systemic VTI:  0.29 m MV E velocity: 84.00 cm/s  Systemic Diam: 2.00 cm MV A velocity: 49.30 cm/s MV E/A ratio:  1.70 Tobias Alexander MD Electronically signed by Tobias Alexander MD Signature Date/Time: 02/12/2020/7:17:10 PM    Final    DG HIP OPERATIVE UNILAT W OR W/O PELVIS LEFT  Result Date: 02/18/2020 CLINICAL DATA:  Total hip replacement EXAM: OPERATIVE LEFT HIP  1 VIEW TECHNIQUE: Fluoroscopic spot image(s) were submitted for interpretation post-operatively. COMPARISON:  January 29, 2020 FLUOROSCOPY TIME:  0 minutes 32 seconds; 7 acquired  images FINDINGS: Initial image demonstrates advanced osteoarthritic change and avascular necrosis involving the left femoral head. Subsequent images show total hip replacement on the left with prosthetic components well-seated on frontal view. No fracture or dislocation. Slight narrowing right hip joint noted. IMPRESSION: Status post total hip replacement on the left with prosthetic components well-seated on frontal view. No fracture or dislocation. Slight narrowing  right hip joint. Electronically Signed   By: Bretta Bang III M.D.   On: 02/18/2020 09:01   DG FEMUR MIN 2 VIEWS LEFT  Result Date: 01/30/2020 CLINICAL DATA:  Bilateral thigh pain EXAM: LEFT FEMUR 2 VIEWS COMPARISON:  None. FINDINGS: Advanced degenerative changes in the left hip with joint space loss, spurring, subchondral sclerosis and cyst formation. Slight flattening of the femoral head. No acute bony abnormality. Specifically, no fracture, subluxation, or dislocation. IMPRESSION: Advanced degenerative changes in the left hip. No acute bony abnormality. Electronically Signed   By: Charlett Nose M.D.   On: 01/30/2020 09:52   DG FEMUR, MIN 2 VIEWS RIGHT  Result Date: 01/30/2020 CLINICAL DATA:  Bilateral 5 pain EXAM: RIGHT FEMUR 2 VIEWS COMPARISON:  11/06/2019 FINDINGS: Moderate osteoarthritis changes in the right hip with joint space narrowing and spurring. This is less pronounced than on the left. No acute bony abnormality. Specifically, no fracture, subluxation, or dislocation. IMPRESSION: Moderate degenerative changes in the right hip. No acute bony abnormality. Electronically Signed   By: Charlett Nose M.D.   On: 01/30/2020 09:53   LONG TERM MONITOR (3-14 DAYS)  Result Date: 02/06/2020 1. NSR with sinus brady and sinus tachycardia 2. NS atrial tachycardia 3. Rare PVC's and PAC's 4. Very brief NS VT 5. No prolonged pauses Sharlot Gowda Taylor,MD   Disposition: Discharge disposition: 01-Home or Self Care          Follow-up  Information    Tarry Kos, MD In 2 weeks.   Specialty: Orthopedic Surgery Why: For suture removal, For wound re-check Contact information: [address] [place] [address] [phone]                Signed: Cristie Hem 02/19/2020, 7:44 AM

## 2020-02-19 NOTE — Progress Notes (Signed)
Physical Therapy Treatment Patient Details Name: Toni Jones MRN: [id number] DOB: [birth year] Today's Date: 02/19/2020    History of Present Illness Patient is 59 y.o. female s/p Lt THA anterior approach on 02/18/20 with PMH significant for HTN, asthma, OA.    PT Comments    POD # 1 pm session Assisted OOB to amb to bathroom.  Pt did void this time.  Assisted with balance as she performed hygiene.  Pt slow, sluggish. General Gait Details: amb a decreased distance due to pt "not feeling so good".  Pulled a computer chair from closests station to sit pt down.  Pt pale.  Vitals taken BP 132/52, HR 58 and RA 93%.  Returned to room in recliner.  Notified RN. Assisted back to bed and pt immediately dozed off.  Pt does NOT have stairs to enter home.  Pt did NOT meet Therapy goals for a safe D/C this afternoon.  Possibly D/C later this evening if increased arousal otherwise will need another day.    Follow Up Recommendations  Follow surgeon's recommendation for DC plan and follow-up therapies;Home health PT     Equipment Recommendations  Rolling walker with 5" wheels    Recommendations for Other Services       Precautions / Restrictions Precautions Precautions: Fall Restrictions Weight Bearing Restrictions: No Other Position/Activity Restrictions: WBAT    Mobility  Bed Mobility Overal bed mobility: Needs Assistance Bed Mobility: Supine to Sit;Sit to Supine     Supine to sit: Min guard Sit to supine: Min assist   General bed mobility comments: demonstarted and instructed how to use a belt to self assist LE.  Required increased assist back to bed.  "I plan to sleep in my recliner".  Transfers Overall transfer level: Needs assistance Equipment used: Rolling walker (2 wheeled) Transfers: Sit to/from [employer] Sit to Stand: Min guard Stand pivot transfers: Min guard;Min assist       General transfer comment: 50% VC's on proper hand placement to avoid pulling  up on walker.  Slow to transition.  Also assisted with a toilet transfer.  Slow.  Groggy.  Ambulation/Gait Ambulation/Gait assistance: Min assist Gait Distance (Feet): 22 Feet Assistive device: Rolling walker (2 wheeled) Gait Pattern/deviations: Step-to pattern;Decreased stride length;Decreased weight shift to left Gait velocity: decreased   General Gait Details: amb a decreased distance due to pt "not feeling so good".  Pulled a computer chair from closests station to sit pt down.  Pt pale.  Vitals taken BP 132/52, HR 58 and RA 93%.  Returned to room in recliner.  Notified RN.   Stairs Stairs:  (no stairs to enter home)           Wheelchair Mobility    Modified Rankin (Stroke Patients Only)       Balance                                            Cognition Arousal/Alertness: Awake/alert                                     General Comments: AxO x 3 very pleasant/intelligent but groggy/sleepy/slow with c/o nausea      Exercises      General Comments        Pertinent Vitals/Pain Pain Assessment: 0-10 Pain Score:  3  Pain Location: Lt hip Pain Descriptors / Indicators: Burning;Aching;Tender;Operative site guarding Pain Intervention(s): Monitored during session;Premedicated before session;Repositioned;Ice applied    Home Living                      Prior Function            PT Goals (current goals can now be found in the care plan section) Progress towards PT goals: Progressing toward goals    Frequency    7X/week      PT Plan Current plan remains appropriate    Co-evaluation              AM-PAC PT "6 Clicks" Mobility   Outcome Measure  Help needed turning from your back to your side while in a flat bed without using bedrails?: A Little Help needed moving from lying on your back to sitting on the side of a flat bed without using bedrails?: A Little Help needed moving to and from a bed to a chair  (including a wheelchair)?: A Little Help needed standing up from a chair using your arms (e.g., wheelchair or bedside chair)?: A Little Help needed to walk in hospital room?: A Little Help needed climbing 3-5 steps with a railing? : A Lot 6 Click Score: 17    End of Session Equipment Utilized During Treatment: Gait belt Activity Tolerance: Patient limited by fatigue Patient left: in bed;with call bell/phone within reach;with bed alarm set Nurse Communication: Mobility status (pt will need 2 PT sessions today) PT Visit Diagnosis: Muscle weakness (generalized) (M62.81);Difficulty in walking, not elsewhere classified (R26.2)     Time: 1410-1435 PT Time Calculation (min) (ACUTE ONLY): 25 min  Charges:  $Gait Training: 8-22 mins $Therapeutic Activity: 8-22 mins                     Felecia Shelling  PTA Acute  Rehabilitation Services Pager      [phone] Office      [phone]

## 2020-02-19 NOTE — TOC Transition Note (Signed)
Transition of Care [facility]) - CM/SW Discharge Note   Patient Details  Name: Toni Jones MRN: [id number] Date of Birth: 1960/07/26  Transition of Care [facility] CM/SW Contact:  Amada Jupiter, LCSW Phone Number: 02/19/2020, 10:31 AM   Clinical Narrative:    Pt anticipates dc home today following therapies.  Confirmed need for rolling walker and HHPT.  Referrals placed (see below).  No further TOC needs.   Final next level of care: Home w Home Health Services Barriers to Discharge: No Barriers Identified   Patient Goals and CMS Choice Patient states their goals for this hospitalization and ongoing recovery are:: go home      Discharge Placement                       Discharge Plan and Services                DME Arranged: Walker rolling DME Agency: [employer] Date DME Agency Contacted: 02/19/20 Time DME Agency Contacted: [phone] Representative spoke with at DME Agency: Harrold Donath HH Arranged: PT HH Agency: [facility] ([facility] (pre-arranged with MD office)        Social Determinants of Health (SDOH) Interventions     Readmission Risk Interventions No flowsheet data found.

## 2020-02-19 NOTE — Progress Notes (Signed)
Physical Therapy Treatment Patient Details Name: Toni Jones MRN: [id number] DOB: 03/13/1960 Today's Date: 02/19/2020    History of Present Illness Patient is 59 y.o. female s/p Lt THA anterior approach on 02/18/20 with PMH significant for HTN, asthma, OA.    PT Comments    POD # 1 am session General Comments: AxO x 3 very pleasant/intelligent but groggy/sleepy/slow with c/o nausea Assisted OOB to amb to bathroom required increased time.  General bed mobility comments: demonstarted and instructed how to use a belt to self assist LE.  Required increased assist back to bed.  "I plan to sleep in my recliner". General transfer comment: 50% VC's on proper hand placement to avoid pulling up on walker.  Slow to transition.  Also assisted with a toilet transfer.  Slow.  Groggy. General Gait Details: 25% VC's on proper upright posture as well as proper walker to self distance.  Slow, sluggish gait with c/o nausea and feeling "hot". Assisted back to bed per PT request to rest.  Encouraged fluids.  Pt will need another PT session before D/C today.   Follow Up Recommendations  Follow surgeon's recommendation for DC plan and follow-up therapies;Home health PT     Equipment Recommendations  Rolling walker with 5" wheels    Recommendations for Other Services       Precautions / Restrictions Precautions Precautions: Fall Restrictions Weight Bearing Restrictions: No Other Position/Activity Restrictions: WBAT    Mobility  Bed Mobility Overal bed mobility: Needs Assistance Bed Mobility: Supine to Sit;Sit to Supine     Supine to sit: Min guard Sit to supine: Min assist   General bed mobility comments: demonstarted and instructed how to use a belt to self assist LE.  Required increased assist back to bed.  "I plan to sleep in my recliner".  Transfers Overall transfer level: Needs assistance Equipment used: Rolling walker (2 wheeled) Transfers: Sit to/from [employer] Sit  to Stand: Min guard Stand pivot transfers: Min guard;Min assist       General transfer comment: 50% VC's on proper hand placement to avoid pulling up on walker.  Slow to transition.  Also assisted with a toilet transfer.  Slow.  Groggy.  Ambulation/Gait Ambulation/Gait assistance: Min assist Gait Distance (Feet): 65 Feet Assistive device: Rolling walker (2 wheeled) Gait Pattern/deviations: Step-to pattern;Decreased stride length;Decreased weight shift to left Gait velocity: decreased   General Gait Details: 25% VC's on proper upright posture as well as proper walker to self distance.  Slow, sluggish gait with c/o nausea and feeling "hot".   Stairs Stairs:  (no stairs to enter home)           Wheelchair Mobility    Modified Rankin (Stroke Patients Only)       Balance                                            Cognition Arousal/Alertness: Awake/alert                                     General Comments: AxO x 3 very pleasant/intelligent but groggy/sleepy/slow with c/o nausea      Exercises      General Comments        Pertinent Vitals/Pain Pain Assessment: 0-10 Pain Score: 3  Pain Location: Lt hip Pain  Descriptors / Indicators: Burning;Aching;Tender;Operative site guarding Pain Intervention(s): Monitored during session;Premedicated before session;Repositioned;Ice applied    Home Living                      Prior Function            PT Goals (current goals can now be found in the care plan section) Progress towards PT goals: Progressing toward goals    Frequency    7X/week      PT Plan Current plan remains appropriate    Co-evaluation              AM-PAC PT "6 Clicks" Mobility   Outcome Measure  Help needed turning from your back to your side while in a flat bed without using bedrails?: A Little Help needed moving from lying on your back to sitting on the side of a flat bed without using  bedrails?: A Little Help needed moving to and from a bed to a chair (including a wheelchair)?: A Little Help needed standing up from a chair using your arms (e.g., wheelchair or bedside chair)?: A Little Help needed to walk in hospital room?: A Little Help needed climbing 3-5 steps with a railing? : A Lot 6 Click Score: 17    End of Session Equipment Utilized During Treatment: Gait belt Activity Tolerance: Patient limited by fatigue Patient left: in bed;with call bell/phone within reach;with bed alarm set Nurse Communication: Mobility status (pt will need 2 PT sessions today) PT Visit Diagnosis: Muscle weakness (generalized) (M62.81);Difficulty in walking, not elsewhere classified (R26.2)     Time: 8250-0370 PT Time Calculation (min) (ACUTE ONLY): 43 min  Charges:  $Gait Training: 23-37 mins $Therapeutic Activity: 8-22 mins                     Felecia Shelling  PTA Acute  Rehabilitation Services Pager      [phone] Office      [phone]

## 2020-02-20 ENCOUNTER — Encounter (HOSPITAL_COMMUNITY): Payer: Self-pay | Admitting: Orthopaedic Surgery

## 2020-02-20 NOTE — Anesthesia Postprocedure Evaluation (Signed)
Anesthesia Post Note  Patient: Toni Jones  Procedure(s) Performed: LEFT TOTAL HIP ARTHROPLASTY ANTERIOR APPROACH (Left Hip)     Patient location during evaluation: PACU Anesthesia Type: MAC and Spinal Level of consciousness: oriented and awake and alert Pain management: pain level controlled Vital Signs Assessment: post-procedure vital signs reviewed and stable Respiratory status: spontaneous breathing, respiratory function stable and patient connected to nasal cannula oxygen Cardiovascular status: blood pressure returned to baseline and stable Postop Assessment: no headache, no backache and no apparent nausea or vomiting Anesthetic complications: no   No complications documented.  Last Vitals:  Vitals:   02/19/20 0911 02/19/20 1319  BP: 115/63 (!) 123/58  Pulse: (!) 59 (!) 54  Resp: 15 16  Temp:  (!) 36.3 C  SpO2: 91% 90%    Last Pain:  Vitals:   02/19/20 1319  TempSrc: Oral  PainSc:    Pain Goal: Patients Stated Pain Goal: 3 (02/19/20 0847)                 [place]

## 2020-02-21 ENCOUNTER — Telehealth: Payer: Self-pay | Admitting: *Deleted

## 2020-02-21 NOTE — Telephone Encounter (Signed)
Received email from [facility] letting us know that patient informed hospital SW before discharge that she was going to a different address than her home listed in the chart. SW gave this to [facility] for start of care services and patient was aware and agreeable. When contacted, patient became upset that this information was given to [facility] agency. Due to patient's response, they won't be providing services. [facility] wanted to let us know.

## 2020-02-21 NOTE — Telephone Encounter (Signed)
That's weird.  Does she not want any HHPT?  How about outpatient PT?

## 2020-02-22 NOTE — Telephone Encounter (Signed)
I have attempted twice now to reach her and left 2 voice mails. No call back yet. I will find out what's going on when she contacts me back.

## 2020-03-04 ENCOUNTER — Encounter: Payer: Self-pay | Admitting: Orthopaedic Surgery

## 2020-03-04 ENCOUNTER — Ambulatory Visit (INDEPENDENT_AMBULATORY_CARE_PROVIDER_SITE_OTHER): Payer: Federal, State, Local not specified - PPO | Admitting: Orthopaedic Surgery

## 2020-03-04 ENCOUNTER — Other Ambulatory Visit: Payer: Self-pay | Admitting: Physician Assistant

## 2020-03-04 ENCOUNTER — Other Ambulatory Visit: Payer: Self-pay | Admitting: Medical-Surgical

## 2020-03-04 ENCOUNTER — Telehealth: Payer: Self-pay

## 2020-03-04 VITALS — Ht 65.0 in | Wt 218.0 lb

## 2020-03-04 DIAGNOSIS — Z96642 Presence of left artificial hip joint: Secondary | ICD-10-CM

## 2020-03-04 MED ORDER — BACLOFEN 10 MG PO TABS: 10.0000 mg | ORAL_TABLET | Freq: Three times a day (TID) | ORAL | 2 refills | Status: DC | PRN

## 2020-03-04 MED ORDER — ONDANSETRON HCL 4 MG PO TABS: 4.0000 mg | ORAL_TABLET | Freq: Three times a day (TID) | ORAL | 0 refills | Status: DC | PRN

## 2020-03-04 NOTE — Telephone Encounter (Signed)
Sent in

## 2020-03-04 NOTE — Telephone Encounter (Signed)
Called and notified patient.

## 2020-03-04 NOTE — Progress Notes (Signed)
   Post-Op Visit Note   Patient: Toni Jones           Date of Birth: April 29, 1960           MRN: [id number] Visit Date: 03/04/2020 PCP: Christen Butter, NP   Assessment & Plan:  Chief Complaint:  Chief Complaint  Patient presents with  . Left Hip - Follow-up    Left total hip arthroplasty 02/18/2020   Visit Diagnoses:  1. Status post total replacement of left hip     Plan:   Toni Jones is 2-week status post left total hip replacement.  She is doing well overall and has home exercises.  She declined home health PT.  Denies any constitutional symptoms.  Walking mainly without any assistive devices.  Surgical incision is healed.  No signs of infection.  Minimal swelling.  Walks with a small limp.  Sutures removed today.  She will continue with home exercises.  Continue aspirin for DVT prophylaxis.  She will need to remain out of work for at least another 4 weeks.  We will reevaluate things at the next visit in 4 weeks.  She will need standing AP pelvis x-rays at that time.  Follow-Up Instructions: Return in about 4 weeks (around 04/01/2020).   Orders:  No orders of the defined types were placed in this encounter.  No orders of the defined types were placed in this encounter.   Imaging: No results found.  PMFS History: Patient Active Problem List   Diagnosis Date Noted  . Status post total replacement of left hip 02/18/2020  . Primary osteoarthritis of left hip 02/15/2020  . Arthritis   . Asthma   . Bronchitis   . Chronic bronchitis (HCC)   . Flesh-eating bacteria (HCC)   . Foot fracture, left   . Bilateral thigh pain 01/29/2020  . Essential hypertension 01/29/2020   Past Medical History:  Diagnosis Date  . Arthritis   . Asthma   . Bronchitis   . Chronic bronchitis (HCC)   . Flesh-eating bacteria (HCC[place]    [place]  . Foot fracture, left   . Hypertension   . Pneumonia    hx of   . PONV (postoperative nausea and vomiting)     Family History  Adopted: Yes   Problem Relation Age of Onset  . Heart attack Mother   . Cancer Son        osteogenic carcinoma    Past Surgical History:  Procedure Laterality Date  . APPENDECTOMY    . left foot surgery     . MANDIBLE SURGERY    . TONSILLECTOMY    . TOTAL HIP ARTHROPLASTY Left 02/18/2020   Procedure: LEFT TOTAL HIP ARTHROPLASTY ANTERIOR APPROACH;  Surgeon: Tarry Kos, MD;  Location: [facility];  Service: Orthopedics;  Laterality: Left;  . WISDOM TOOTH EXTRACTION     Social History   Occupational History  . Not on file  Tobacco Use  . Smoking status: Never Smoker  . Smokeless tobacco: Never Used  Vaping Use  . Vaping Use: Never used  Substance and Sexual Activity  . Alcohol use: Never  . Drug use: Never  . Sexual activity: Not Currently    Partners: Male

## 2020-03-04 NOTE — Telephone Encounter (Signed)
Patient called she is requesting rx refill for Zofran and baclofen  [phone]

## 2020-03-14 ENCOUNTER — Ambulatory Visit (INDEPENDENT_AMBULATORY_CARE_PROVIDER_SITE_OTHER): Payer: Federal, State, Local not specified - PPO | Admitting: Orthopaedic Surgery

## 2020-03-14 ENCOUNTER — Encounter: Payer: Self-pay | Admitting: Physician Assistant

## 2020-03-14 ENCOUNTER — Other Ambulatory Visit: Payer: Self-pay | Admitting: Physician Assistant

## 2020-03-14 DIAGNOSIS — M7061 Trochanteric bursitis, right hip: Secondary | ICD-10-CM | POA: Diagnosis not present

## 2020-03-14 MED ORDER — PROMETHAZINE HCL 12.5 MG PO TABS: 12.5000 mg | ORAL_TABLET | Freq: Two times a day (BID) | ORAL | 0 refills | Status: DC | PRN

## 2020-03-14 MED ORDER — BACLOFEN 10 MG PO TABS: 10.0000 mg | ORAL_TABLET | Freq: Three times a day (TID) | ORAL | 0 refills | Status: DC

## 2020-03-14 NOTE — Progress Notes (Signed)
Office Visit Note   Patient: Toni Jones           Date of Birth: 1960/12/27           MRN: [id number] Visit Date: 03/14/2020              Requested by: Christen Butter, NP [address] [place],  [place] [address] PCP: Christen Butter, NP   Assessment & Plan: Visit Diagnoses:  1. Trochanteric bursitis, right hip     Plan: Impression is right hip trochanteric bursitis with underlying advanced degenerative joint disease.  We discussed iliotibial band stretching versus cortisone injection.  The patient would like to try stretching for now.  She has been instructed to not do these stretches on the left side.  She will follow-up with [place] as needed for her right hip.  Follow-Up Instructions: Return for at already scheduled appointment.   Orders:  No orders of the defined types were placed in this encounter.  No orders of the defined types were placed in this encounter.     Procedures: No procedures performed   Clinical Data: No additional findings.   Subjective: Chief Complaint  Patient presents with  . Right Hip - Pain    HPI patient is a pleasant 60 year old female who comes in today with right lateral hip pain.  Pain she has is all to the lateral hip.  She denies any buttocks or groin pain.  She notes that she has had increased weakness as well to the right hip following left total hip replacement.  The pain is increased when she is sleeping on the right side.  No numbness, tingling or burning.  Review of Systems as detailed in HPI.  All others reviewed and are negative.   Objective: Vital Signs: There were no vitals taken for this visit.  Physical Exam well-developed well-nourished female no acute distress.  Alert and oriented x3.  Ortho Exam right hip exam shows marked tenderness to the greater trochanter.  Very little rotation of the right hip without pain.  Negative straight leg raise.  She is neurovascular intact distally.  Specialty Comments:  No  specialty comments available.  Imaging: No new imaging   PMFS History: Patient Active Problem List   Diagnosis Date Noted  . Status post total replacement of left hip 02/18/2020  . Primary osteoarthritis of left hip 02/15/2020  . Arthritis   . Asthma   . Bronchitis   . Chronic bronchitis (HCC)   . Flesh-eating bacteria (HCC)   . Foot fracture, left   . Bilateral thigh pain 01/29/2020  . Essential hypertension 01/29/2020   Past Medical History:  Diagnosis Date  . Arthritis   . Asthma   . Bronchitis   . Chronic bronchitis (HCC)   . Flesh-eating bacteria (HCC)    [place]  . Foot fracture, left   . Hypertension   . Pneumonia    hx of   . PONV (postoperative nausea and vomiting)     Family History  Adopted: Yes  Problem Relation Age of Onset  . Heart attack Mother   . Cancer Son        osteogenic carcinoma    Past Surgical History:  Procedure Laterality Date  . APPENDECTOMY    . left foot surgery     . MANDIBLE SURGERY    . TONSILLECTOMY    . TOTAL HIP ARTHROPLASTY Left 02/18/2020   Procedure: LEFT TOTAL HIP ARTHROPLASTY ANTERIOR APPROACH;  Surgeon: Roda Shutters,  Edwin Cap, MD;  Location: [facility];  Service: Orthopedics;  Laterality: Left;  . WISDOM TOOTH EXTRACTION     Social History   Occupational History  . Not on file  Tobacco Use  . Smoking status: Never Smoker  . Smokeless tobacco: Never Used  Vaping Use  . Vaping Use: Never used  Substance and Sexual Activity  . Alcohol use: Never  . Drug use: Never  . Sexual activity: Not Currently    Partners: Male

## 2020-04-01 ENCOUNTER — Ambulatory Visit (INDEPENDENT_AMBULATORY_CARE_PROVIDER_SITE_OTHER): Payer: Federal, State, Local not specified - PPO | Admitting: Orthopaedic Surgery

## 2020-04-01 ENCOUNTER — Ambulatory Visit: Payer: Self-pay

## 2020-04-01 ENCOUNTER — Encounter: Payer: Self-pay | Admitting: Orthopaedic Surgery

## 2020-04-01 DIAGNOSIS — M1611 Unilateral primary osteoarthritis, right hip: Secondary | ICD-10-CM

## 2020-04-01 DIAGNOSIS — M7061 Trochanteric bursitis, right hip: Secondary | ICD-10-CM

## 2020-04-01 DIAGNOSIS — Z96642 Presence of left artificial hip joint: Secondary | ICD-10-CM

## 2020-04-01 MED ORDER — LIDOCAINE HCL 1 % IJ SOLN
3.0000 mL | INTRAMUSCULAR | Status: AC | PRN
  Administered 2020-04-01: 3 mL

## 2020-04-01 MED ORDER — METHYLPREDNISOLONE ACETATE 40 MG/ML IJ SUSP
40.0000 mg | INTRAMUSCULAR | Status: AC | PRN
  Administered 2020-04-01: 40 mg via INTRA_ARTICULAR

## 2020-04-01 MED ORDER — BUPIVACAINE HCL 0.25 % IJ SOLN
2.0000 mL | INTRAMUSCULAR | Status: AC | PRN
  Administered 2020-04-01: 2 mL via INTRA_ARTICULAR

## 2020-04-01 NOTE — Progress Notes (Signed)
Office Visit Note   Patient: Toni Jones           Date of Birth: 1960/08/12           MRN: [id number] Visit Date: 04/01/2020              Requested by: Christen Butter, NP [address] [place],  [place] [address] PCP: Christen Butter, NP   Assessment & Plan: Visit Diagnoses:  1. Status post total replacement of left hip   2. Unilateral primary osteoarthritis, right hip   3. Trochanteric bursitis, right hip     Plan: Impression is status post left total hip replacement, right hip advanced degenerative joint disease and right hip trochanteric bursitis.  In regards to the left hip, she will continue to advance with activity as tolerated.  She will continue with her home exercise program as well.  We will need to keep her out of work for another 6 weeks.  In regards to her right hip, we will proceed with trochanteric bursa injection today.  We will also go ahead and schedule her for right total hip replacement for mid March.  She will follow up with [place] in 6 weeks time for repeat evaluation of the left hip.  Call with concerns or questions in the meantime.  Dental prophylaxis reinforced.  Follow-Up Instructions: Return in about 6 weeks (around 05/13/2020).   Orders:  Orders Placed This Encounter  Procedures  . Large Joint Inj: R greater trochanter  . XR HIP UNILAT W OR W/O PELVIS 2-3 VIEWS LEFT   No orders of the defined types were placed in this encounter.     Procedures: Large Joint Inj: R greater trochanter on 04/01/2020 10:40 AM Indications: pain Details: 22 G needle, lateral approach Medications: 3 mL lidocaine 1 %; 2 mL bupivacaine 0.25 %; 40 mg methylPREDNISolone acetate 40 MG/ML      Clinical Data: No additional findings.   Subjective: Chief Complaint  Patient presents with  . Left Hip - Routine Post Op    HPI patient is a 60 year old female who comes in today 6 weeks out left anterior total hip replacement.  She has been doing well.  She has  regained near full range of motion and strength.  Minimal to no pain.  Other issue remains her right hip.  She has had pain not only to the groin but to the lateral hip which has progressively worsened recently.  She does have a history of osteoarthritis to the right hip as well as trochanteric bursitis.  She was provided with iliotibial band stretches a few weeks ago which does not seem to alleviate her symptoms.  Review of Systems as detailed in HPI.  All others reviewed and are negative.   Objective: Vital Signs: There were no vitals taken for this visit.  Physical Exam well-developed well-nourished female no acute distress.  Alert and oriented x3.  Ortho Exam left hip exam shows a negative logroll.  Near full range of motion and strength.  Right hip exam shows no pain with logroll but she does have very limited internal rotation.  Marked tenderness the greater trochanter.  She is neurovascular intact distally.  Specialty Comments:  No specialty comments available.  Imaging: No new imaging   PMFS History: Patient Active Problem List   Diagnosis Date Noted  . Status post total replacement of left hip 02/18/2020  . Primary osteoarthritis of left hip 02/15/2020  . Arthritis   . Asthma   .  Bronchitis   . Chronic bronchitis (HCC)   . Flesh-eating bacteria (HCC)   . Foot fracture, left   . Bilateral thigh pain 01/29/2020  . Essential hypertension 01/29/2020   Past Medical History:  Diagnosis Date  . Arthritis   . Asthma   . Bronchitis   . Chronic bronchitis (HCC)   . Flesh-eating bacteria (HCC)    [place]  . Foot fracture, left   . Hypertension   . Pneumonia    hx of   . PONV (postoperative nausea and vomiting)     Family History  Adopted: Yes  Problem Relation Age of Onset  . Heart attack Mother   . Cancer Son        osteogenic carcinoma    Past Surgical History:  Procedure Laterality Date  . APPENDECTOMY    . left foot surgery     . MANDIBLE SURGERY     . TONSILLECTOMY    . TOTAL HIP ARTHROPLASTY Left 02/18/2020   Procedure: LEFT TOTAL HIP ARTHROPLASTY ANTERIOR APPROACH;  Surgeon: Tarry Kos, MD;  Location: [facility];  Service: Orthopedics;  Laterality: Left;  . WISDOM TOOTH EXTRACTION     Social History   Occupational History  . Not on file  Tobacco Use  . Smoking status: Never Smoker  . Smokeless tobacco: Never Used  Vaping Use  . Vaping Use: Never used  Substance and Sexual Activity  . Alcohol use: Never  . Drug use: Never  . Sexual activity: Not Currently    Partners: Male

## 2020-04-07 ENCOUNTER — Other Ambulatory Visit: Payer: Self-pay

## 2020-04-21 ENCOUNTER — Ambulatory Visit: Payer: Federal, State, Local not specified - PPO | Admitting: Medical-Surgical

## 2020-04-21 ENCOUNTER — Other Ambulatory Visit: Payer: Self-pay

## 2020-04-21 VITALS — BP 167/78 | HR 77 | Temp 97.7°F | Ht 65.0 in | Wt 227.1 lb

## 2020-04-21 DIAGNOSIS — N3000 Acute cystitis without hematuria: Secondary | ICD-10-CM

## 2020-04-21 DIAGNOSIS — R300 Dysuria: Secondary | ICD-10-CM

## 2020-04-21 DIAGNOSIS — I10 Essential (primary) hypertension: Secondary | ICD-10-CM

## 2020-04-21 LAB — POCT URINALYSIS DIPSTICK
Bilirubin, UA: NEGATIVE
Blood, UA: NEGATIVE
Glucose, UA: NEGATIVE
Ketones, UA: NEGATIVE
Nitrite, UA: NEGATIVE
Odor: NEGATIVE
Protein, UA: NEGATIVE
Spec Grav, UA: 1.015 (ref 1.010–1.025)
Urobilinogen, UA: 0.2 E.U./dL
pH, UA: 7.5 (ref 5.0–8.0)

## 2020-04-21 MED ORDER — PROMETHAZINE HCL 12.5 MG PO TABS: 12.5000 mg | ORAL_TABLET | Freq: Every day | ORAL | 0 refills | Status: DC | PRN

## 2020-04-21 MED ORDER — LOSARTAN POTASSIUM 50 MG PO TABS: 50.0000 mg | ORAL_TABLET | Freq: Every day | ORAL | 1 refills | Status: DC

## 2020-04-21 MED ORDER — BACLOFEN 10 MG PO TABS: 10.0000 mg | ORAL_TABLET | Freq: Every evening | ORAL | 0 refills | Status: DC | PRN

## 2020-04-21 MED ORDER — SULFAMETHOXAZOLE-TRIMETHOPRIM 800-160 MG PO TABS: 1.0000 | ORAL_TABLET | Freq: Two times a day (BID) | ORAL | 0 refills | Status: DC

## 2020-04-21 NOTE — Progress Notes (Signed)
Subjective:    CC: urinary s/s  HPI: Pleasant 60 year old female presenting for evaluation of urinary symptoms including frequency, urgency, suprapubic pain, foul odor, cloudy urine. Has experienced urinary incontinence over the past month since having her surgery. No blood in her urine. Drinks plenty of water and has been taking cranberry pills prn. Denies fever, chills, flank pain, nausea, vomiting, diarrhea, constipation, and vaginal symptoms.  HTN- BP elevated in office today. Patient reports that she stopped taking all of her medications outside of her vitamins shortly after her surgery on the left hip. Has not been monitoring her BP. Felt overwhelmed after visiting the cardiologist where they put her on two extra medications in addition to what she had prescribed with me. Wasn't sure what she should be taking so she decided not to take any of them. Has had no further issues with palpitations. Denies vision changes, chest pain, SOB, lower extremity edema, and headaches.   Requesting refills on Baclofen and Phenergan. Takes the Baclofen at night to help with her continued right hip pain. Usually takes phenergan with it since she wakes at night feeling nauseous and vomiting.    I reviewed the past medical history, family history, social history, surgical history, and allergies today and no changes were needed.  Please see the problem list section below in epic for further details.  Past Medical History: Past Medical History:  Diagnosis Date  . Arthritis   . Asthma   . Bronchitis   . Chronic bronchitis (HCC)   . Flesh-eating bacteria (HCC)    [place]  . Foot fracture, left   . Hypertension   . Pneumonia    hx of   . PONV (postoperative nausea and vomiting)    Past Surgical History: Past Surgical History:  Procedure Laterality Date  . APPENDECTOMY    . left foot surgery     . MANDIBLE SURGERY    . TONSILLECTOMY    . TOTAL HIP ARTHROPLASTY Left 02/18/2020   Procedure:  LEFT TOTAL HIP ARTHROPLASTY ANTERIOR APPROACH;  Surgeon: Tarry Kos, MD;  Location: [facility];  Service: Orthopedics;  Laterality: Left;  . WISDOM TOOTH EXTRACTION     Social History: Social History   Socioeconomic History  . Marital status: Divorced    Spouse name: Not on file  . Number of children: Not on file  . Years of education: Not on file  . Highest education level: Not on file  Occupational History  . Not on file  Tobacco Use  . Smoking status: Never Smoker  . Smokeless tobacco: Never Used  Vaping Use  . Vaping Use: Never used  Substance and Sexual Activity  . Alcohol use: Never  . Drug use: Never  . Sexual activity: Not Currently    Partners: Male  Other Topics Concern  . Not on file  Social History Narrative  . Not on file   Social Determinants of Health   Financial Resource Strain: Not on file  Food Insecurity: Not on file  Transportation Needs: Not on file  Physical Activity: Not on file  Stress: Not on file  Social Connections: Not on file   Family History: Family History  Adopted: Yes  Problem Relation Age of Onset  . Heart attack Mother   . Cancer Son        osteogenic carcinoma   Allergies: Allergies  Allergen Reactions  . Onion Other (See Comments)    Nose bleeds  . Red Dye #40 [Red Dye] Hives  Medications: See med rec.  Review of Systems: See HPI for pertinent positives and negatives.   Objective:    General: Well Developed, well nourished, and in no acute distress.  Neuro: Alert and oriented x3.  HEENT: Normocephalic, atraumatic.  Skin: Warm and dry. Cardiac: Regular rate and rhythm, no murmurs rubs or gallops, no lower extremity edema.  Respiratory: Clear to auscultation bilaterally. Not using accessory muscles, speaking in full sentences.   Impression and Recommendations:    1. Dysuria POCT UA + small leukocytes, negative for nitrites/protein/blood. Sending for culture.  - POCT Urinalysis Dipstick - Urine Culture  2.  Acute cystitis without hematuria Since she is symptomatic with pyuria, will go ahead and treat with Bactrim DS BID x 3 days. Continue to push PO fluids. Discussed bladder hygiene.   3. Essential hypertension Restart Losartan at higher dose of 50mg  daily. Will hold off on restarting Coreg since she has had no further issue with palpitations. Continue low sodium diet. Reviewed s/s of low blood pressure.   Return in about 2 weeks (around 05/05/2020) for nurse visit for BP check. ___________________________________________ 05/07/2020, DNP, APRN, FNP-BC Primary Care and Sports Medicine [facility] [place]

## 2020-04-23 LAB — URINE CULTURE
MICRO NUMBER:: 11533396
SPECIMEN QUALITY:: ADEQUATE

## 2020-04-25 ENCOUNTER — Ambulatory Visit (INDEPENDENT_AMBULATORY_CARE_PROVIDER_SITE_OTHER): Payer: Federal, State, Local not specified - PPO | Admitting: Orthopaedic Surgery

## 2020-04-25 DIAGNOSIS — Z96642 Presence of left artificial hip joint: Secondary | ICD-10-CM

## 2020-04-25 DIAGNOSIS — M1611 Unilateral primary osteoarthritis, right hip: Secondary | ICD-10-CM | POA: Diagnosis not present

## 2020-04-25 HISTORY — DX: Unilateral primary osteoarthritis, right hip: M16.11

## 2020-04-25 NOTE — Progress Notes (Signed)
Office Visit Note   Patient: Toni Jones           Date of Birth: 06-21-1960           MRN: [id number] Visit Date: 04/25/2020              Requested by: Christen Butter, NP [address] [place],  [place] [address] PCP: Christen Butter, NP   Assessment & Plan: Visit Diagnoses:  1. Status post total replacement of left hip   2. Primary osteoarthritis of right hip     Plan: Impression is a week status post left total hip replacement and end-stage right hip DJD.  In terms of the left hip she is very happy and has progressed and recovered well.  She will continue to do her exercises and progress with strengthening.  For the right hip that she is scheduled for right hip replacement on March 7.  I filled out all of her TSA paperwork detailing her restrictions.  Questions encouraged and answered.  We look forward to treating her in the operating room in the near future.  Follow-Up Instructions: Return if symptoms worsen or fail to improve.   Orders:  No orders of the defined types were placed in this encounter.  No orders of the defined types were placed in this encounter.     Procedures: No procedures performed   Clinical Data: No additional findings.   Subjective: Chief Complaint  Patient presents with  . Left Hip - Follow-up    Toni Jones is a week status post left total hip replacement and also following up for right hip DJD.  She has forms for me to fill out today regarding restrictions for work.  She is scheduled for right total hip replacement in March 7.  Her left hip is feeling better and getting stronger every week.  Her right hip is causing her severe pain.  She is limping quite a bit.   Review of Systems   Objective: Vital Signs: There were no vitals taken for this visit.  Physical Exam  Ortho Exam Left hip shows fully healed surgical scar.  Painless range of motion. Right hip shows pain with internal and external rotation.  Antalgic  gait. Specialty Comments:  No specialty comments available.  Imaging: No results found.   PMFS History: Patient Active Problem List   Diagnosis Date Noted  . Primary osteoarthritis of right hip 04/25/2020  . Status post total replacement of left hip 02/18/2020  . Primary osteoarthritis of left hip 02/15/2020  . Arthritis   . Asthma   . Bronchitis   . Chronic bronchitis (HCC)   . Flesh-eating bacteria (HCC)   . Foot fracture, left   . Bilateral thigh pain 01/29/2020  . Essential hypertension 01/29/2020   Past Medical History:  Diagnosis Date  . Arthritis   . Asthma   . Bronchitis   . Chronic bronchitis (HCC)   . Flesh-eating bacteria (HCC[place]    [place]  . Foot fracture, left   . Hypertension   . Pneumonia    hx of   . PONV (postoperative nausea and vomiting)     Family History  Adopted: Yes  Problem Relation Age of Onset  . Heart attack Mother   . Cancer Son        osteogenic carcinoma    Past Surgical History:  Procedure Laterality Date  . APPENDECTOMY    . left foot surgery     . MANDIBLE SURGERY    .  TONSILLECTOMY    . TOTAL HIP ARTHROPLASTY Left 02/18/2020   Procedure: LEFT TOTAL HIP ARTHROPLASTY ANTERIOR APPROACH;  Surgeon: Tarry Kos, MD;  Location: [facility];  Service: Orthopedics;  Laterality: Left;  . WISDOM TOOTH EXTRACTION     Social History   Occupational History  . Not on file  Tobacco Use  . Smoking status: Never Smoker  . Smokeless tobacco: Never Used  Vaping Use  . Vaping Use: Never used  Substance and Sexual Activity  . Alcohol use: Never  . Drug use: Never  . Sexual activity: Not Currently    Partners: Male

## 2020-05-05 ENCOUNTER — Ambulatory Visit (INDEPENDENT_AMBULATORY_CARE_PROVIDER_SITE_OTHER): Payer: Federal, State, Local not specified - PPO | Admitting: Medical-Surgical

## 2020-05-05 ENCOUNTER — Other Ambulatory Visit: Payer: Self-pay

## 2020-05-05 VITALS — BP 152/60 | HR 81

## 2020-05-05 DIAGNOSIS — I10 Essential (primary) hypertension: Secondary | ICD-10-CM

## 2020-05-05 NOTE — Progress Notes (Signed)
Established Patient Office Visit  Subjective:  Patient ID: Toni Jones, female    DOB: July 06, 1960  Age: 60 y.o. MRN: [id number]  CC:  Chief Complaint  Patient presents with  . Hypertension    HPI Toni Jones presents for blood pressure check. Denies chest pain, shortness of breath or headaches.   BP Readings from Last 3 Encounters:  05/05/20 (!) 152/60  04/21/20 (!) 167/78  02/19/20 (!) 123/58    Past Medical History:  Diagnosis Date  . Arthritis   . Asthma   . Bronchitis   . Chronic bronchitis (HCC)   . Flesh-eating bacteria (HCC)    [place]  . Foot fracture, left   . Hypertension   . Pneumonia    hx of   . PONV (postoperative nausea and vomiting)     Past Surgical History:  Procedure Laterality Date  . APPENDECTOMY    . left foot surgery     . MANDIBLE SURGERY    . TONSILLECTOMY    . TOTAL HIP ARTHROPLASTY Left 02/18/2020   Procedure: LEFT TOTAL HIP ARTHROPLASTY ANTERIOR APPROACH;  Surgeon: Tarry Kos, MD;  Location: [facility];  Service: Orthopedics;  Laterality: Left;  . WISDOM TOOTH EXTRACTION      Family History  Adopted: Yes  Problem Relation Age of Onset  . Heart attack Mother   . Cancer Son        osteogenic carcinoma    Social History   Socioeconomic History  . Marital status: Divorced    Spouse name: Not on file  . Number of children: Not on file  . Years of education: Not on file  . Highest education level: Not on file  Occupational History  . Not on file  Tobacco Use  . Smoking status: Never Smoker  . Smokeless tobacco: Never Used  Vaping Use  . Vaping Use: Never used  Substance and Sexual Activity  . Alcohol use: Never  . Drug use: Never  . Sexual activity: Not Currently    Partners: Male  Other Topics Concern  . Not on file  Social History Narrative  . Not on file   Social Determinants of Health   Financial Resource Strain: Not on file  Food Insecurity: Not on file  Transportation Needs: Not on file   Physical Activity: Not on file  Stress: Not on file  Social Connections: Not on file  Intimate Partner Violence: Not on file    Outpatient Medications Prior to Visit  Medication Sig Dispense Refill  . albuterol (VENTOLIN HFA) 108 (90 Base) MCG/ACT inhaler Inhale 1-2 puffs into the lungs every 6 (six) hours as needed for wheezing or shortness of breath. 1 Inhaler 0  . baclofen (LIORESAL) 10 MG tablet Take 1 tablet (10 mg total) by mouth at bedtime as needed for muscle spasms. (Patient taking differently: Take 10 mg by mouth at bedtime.) 60 each 0  . CALCIUM-MAGNESIUM-VITAMIN D PO Take 1 tablet by mouth daily. 1000 mg / 500 mg    . CALCIUM-VITAMIN D PO Take 2 each by mouth daily. Gummies    . ferrous gluconate (FERGON) 324 MG tablet Take 1 tablet (324 mg total) by mouth daily with breakfast. 90 tablet 1  . losartan (COZAAR) 50 MG tablet Take 1 tablet (50 mg total) by mouth daily. 90 tablet 1  . Multiple Vitamins-Minerals (MULTIVITAMIN ADULT) CHEW Chew 2 each by mouth daily. Woman 50 +    . promethazine (PHENERGAN) 12.5 MG tablet Take 1 tablet (12.5  mg total) by mouth daily as needed for nausea or vomiting. (Patient taking differently: Take 12.5 mg by mouth at bedtime.) 60 tablet 0  . sulfamethoxazole-trimethoprim (BACTRIM DS) 800-160 MG tablet Take 1 tablet by mouth 2 (two) times daily. 6 tablet 0   No facility-administered medications prior to visit.    Allergies  Allergen Reactions  . Onion Other (See Comments)    Raw onion oil Nose bleeds  . Red Dye #40 [Red Dye] Hives    ROS Review of Systems    Objective:    Physical Exam  BP (!) 152/60   Pulse 81   SpO2 100%  Wt Readings from Last 3 Encounters:  04/21/20 227 lb 1.6 oz (103 kg)  03/04/20 218 lb (98.9 kg)  02/18/20 218 lb 3.7 oz (99 kg)     Health Maintenance Due  Topic Date Due  . PAP SMEAR-Modifier  Never done    There are no preventive care reminders to display for this patient.  Lab Results  Component  Value Date   TSH 1.83 01/15/2020   Lab Results  Component Value Date   WBC 14.7 (H) 02/19/2020   HGB 8.7 (L) 02/19/2020   HCT 28.7 (L) 02/19/2020   MCV 72.3 (L) 02/19/2020   PLT 359 02/19/2020   Lab Results  Component Value Date   NA 138 02/19/2020   K 4.4 02/19/2020   CO2 22 02/19/2020   GLUCOSE 138 (H) 02/19/2020   BUN 21 (H) 02/19/2020   CREATININE 0.90 02/19/2020   BILITOT 0.5 02/15/2020   ALKPHOS 72 02/15/2020   AST 19 02/15/2020   ALT 18 02/15/2020   PROT 7.1 02/15/2020   ALBUMIN 4.4 02/15/2020   CALCIUM 8.6 (L) 02/19/2020   ANIONGAP 10 02/19/2020   No results found for: CHOL No results found for: HDL No results found for: LDLCALC No results found for: TRIG No results found for: CHOLHDL No results found for: EQAS3M    Assessment & Plan:  Hypertension - Continue current medications. Follow up in 3 months with Joy, per Joy.    Problem List Items Addressed This Visit    Essential hypertension - Primary      No orders of the defined types were placed in this encounter.   Follow-up: Return in about 3 months (around 08/02/2020) for HTN with Joy. Earna Coder, Janalyn Harder, CMA

## 2020-05-05 NOTE — Progress Notes (Signed)
Initial reading 113/93 and recheck 152/60. Patient endorses anxiety that increases with visits to the office. Will continue her current regimen and follow up in 3 months.

## 2020-05-06 DIAGNOSIS — K045 Chronic apical periodontitis: Secondary | ICD-10-CM | POA: Diagnosis not present

## 2020-05-08 ENCOUNTER — Other Ambulatory Visit (HOSPITAL_COMMUNITY)
Admission: RE | Admit: 2020-05-08 | Discharge: 2020-05-08 | Disposition: A | Discharge status: Home / Self Care | Payer: Federal, State, Local not specified - PPO | Source: Ambulatory Visit | Attending: Orthopaedic Surgery | Admitting: Orthopaedic Surgery

## 2020-05-08 ENCOUNTER — Encounter (HOSPITAL_COMMUNITY): Payer: Self-pay

## 2020-05-08 ENCOUNTER — Encounter (HOSPITAL_COMMUNITY)
Admission: RE | Admit: 2020-05-08 | Discharge: 2020-05-08 | Disposition: A | Discharge status: Home / Self Care | Payer: Federal, State, Local not specified - PPO | Source: Ambulatory Visit | Attending: Orthopaedic Surgery | Admitting: Orthopaedic Surgery

## 2020-05-08 ENCOUNTER — Other Ambulatory Visit: Payer: Self-pay

## 2020-05-08 DIAGNOSIS — Z20822 Contact with and (suspected) exposure to covid-19: Secondary | ICD-10-CM | POA: Diagnosis not present

## 2020-05-08 DIAGNOSIS — Z01812 Encounter for preprocedural laboratory examination: Secondary | ICD-10-CM | POA: Insufficient documentation

## 2020-05-08 DIAGNOSIS — D649 Anemia, unspecified: Secondary | ICD-10-CM | POA: Diagnosis not present

## 2020-05-08 DIAGNOSIS — M1611 Unilateral primary osteoarthritis, right hip: Secondary | ICD-10-CM | POA: Insufficient documentation

## 2020-05-08 LAB — URINALYSIS, ROUTINE W REFLEX MICROSCOPIC
Bilirubin Urine: NEGATIVE
Glucose, UA: NEGATIVE mg/dL
Hgb urine dipstick: NEGATIVE
Ketones, ur: NEGATIVE mg/dL
Leukocytes,Ua: NEGATIVE
Nitrite: NEGATIVE
Protein, ur: NEGATIVE mg/dL
Specific Gravity, Urine: 1.005 (ref 1.005–1.030)
pH: 6 (ref 5.0–8.0)

## 2020-05-08 LAB — CBC WITH DIFFERENTIAL/PLATELET
Abs Immature Granulocytes: 0.06 10*3/uL (ref 0.00–0.07)
Basophils Absolute: 0.1 10*3/uL (ref 0.0–0.1)
Basophils Relative: 1 %
Eosinophils Absolute: 0.6 10*3/uL — ABNORMAL HIGH (ref 0.0–0.5)
Eosinophils Relative: 6 %
HCT: 33.2 % — ABNORMAL LOW (ref 36.0–46.0)
Hemoglobin: 9.9 g/dL — ABNORMAL LOW (ref 12.0–15.0)
Immature Granulocytes: 1 %
Lymphocytes Relative: 41 %
Lymphs Abs: 4.2 10*3/uL — ABNORMAL HIGH (ref 0.7–4.0)
MCH: 21 pg — ABNORMAL LOW (ref 26.0–34.0)
MCHC: 29.8 g/dL — ABNORMAL LOW (ref 30.0–36.0)
MCV: 70.5 fL — ABNORMAL LOW (ref 80.0–100.0)
Monocytes Absolute: 0.8 10*3/uL (ref 0.1–1.0)
Monocytes Relative: 8 %
Neutro Abs: 4.6 10*3/uL (ref 1.7–7.7)
Neutrophils Relative %: 43 %
Platelets: 393 10*3/uL (ref 150–400)
RBC: 4.71 MIL/uL (ref 3.87–5.11)
RDW: 19.6 % — ABNORMAL HIGH (ref 11.5–15.5)
WBC: 10.4 10*3/uL (ref 4.0–10.5)
nRBC: 0 % (ref 0.0–0.2)

## 2020-05-08 LAB — PROTIME-INR
INR: 1 (ref 0.8–1.2)
Prothrombin Time: 12.9 seconds (ref 11.4–15.2)

## 2020-05-08 LAB — COMPREHENSIVE METABOLIC PANEL
ALT: 20 U/L (ref 0–44)
AST: 19 U/L (ref 15–41)
Albumin: 3.6 g/dL (ref 3.5–5.0)
Alkaline Phosphatase: 63 U/L (ref 38–126)
Anion gap: 9 (ref 5–15)
BUN: 18 mg/dL (ref 6–20)
CO2: 26 mmol/L (ref 22–32)
Calcium: 9.2 mg/dL (ref 8.9–10.3)
Chloride: 106 mmol/L (ref 98–111)
Creatinine, Ser: 0.89 mg/dL (ref 0.44–1.00)
GFR, Estimated: >60 mL/min (ref >60)
Glucose, Bld: 89 mg/dL (ref 70–99)
Potassium: 3.8 mmol/L (ref 3.5–5.1)
Sodium: 141 mmol/L (ref 135–145)
Total Bilirubin: 0.6 mg/dL (ref 0.3–1.2)
Total Protein: 6.5 g/dL (ref 6.5–8.1)

## 2020-05-08 LAB — SURGICAL PCR SCREEN
MRSA, PCR: NEGATIVE
Staphylococcus aureus: NEGATIVE

## 2020-05-08 LAB — TYPE AND SCREEN
ABO/RH(D): O POS
Antibody Screen: NEGATIVE

## 2020-05-08 LAB — APTT: aPTT: 26 seconds (ref 24–36)

## 2020-05-08 NOTE — Progress Notes (Signed)
Your procedure is scheduled on Monday May 12, 2020.  Report to [facility] Main Entrance "A" at 05:30 A.M., and check in at the Admitting office.  Call this number if you have problems the morning of surgery: [phone]  Call [phone] if you have any questions prior to your surgery date Monday-Friday 8am-4pm   Remember: Do not eat after midnight the night before your surgery  You may drink clear liquids until 04:15 A.M. the morning of your surgery.   Clear liquids allowed are: Water, Non-Citrus Juices (without pulp), Carbonated Beverages, Clear Tea, Black Coffee Only, and Gatorade  Please complete your PRE-SURGERY ENSURE that was provided to you by 04:15 A.M. the morning of your surgery. Please, if able, drink it in one setting. DO NOT SIP.    Take these medicines the morning of surgery with A SIP OF WATER: sulfamethoxazole-trimethoprim (BACTRIM DS)   If needed: albuterol (VENTOLIN HFA) ---- Please bring all inhalers with you the day of surgery.    As of today, STOP taking any Aspirin (unless otherwise instructed by your surgeon), Aleve, Naproxen, Ibuprofen, Motrin, Advil, Goody's, BC's, all herbal medications, fish oil, and all vitamins.    The Morning of Surgery  Do not wear jewelry, make-up or nail polish.  Do not wear lotions, powders, or perfumes, or deodorant  Do not shave 48 hours prior to surgery.    Do not bring valuables to the hospital.  [facility] is not responsible for any belongings or valuables.  If you are a smoker, DO NOT Smoke 24 hours prior to surgery  If you wear a CPAP at night please bring your mask the morning of surgery   Remember that you must have someone to transport you home after your surgery, and remain with you for 24 hours if you are discharged the same day.   Please bring cases for contacts, glasses, hearing aids, dentures or bridgework because it cannot be worn into surgery.    Leave your suitcase in the car.  After surgery it  may be brought to your room.  For patients admitted to the hospital, discharge time will be determined by your treatment team.  Patients discharged the day of surgery will not be allowed to drive home.    Special instructions:   [facility]- Preparing For Surgery  Before surgery, you can play an important role. Because skin is not sterile, your skin needs to be as free of germs as possible. You can reduce the number of germs on your skin by washing with CHG (chlorahexidine gluconate) Soap before surgery.  CHG is an antiseptic cleaner which kills germs and bonds with the skin to continue killing germs even after washing.    Oral Hygiene is also important to reduce your risk of infection.  Remember - BRUSH YOUR TEETH THE MORNING OF SURGERY WITH YOUR REGULAR TOOTHPASTE  Please do not use if you have an allergy to CHG or antibacterial soaps. If your skin becomes reddened/irritated stop using the CHG.  Do not shave (including legs and underarms) for at least 48 hours prior to first CHG shower. It is OK to shave your face.  Please follow these instructions carefully.   1. Shower the NIGHT BEFORE SURGERY and the MORNING OF SURGERY with CHG Soap.   2. If you chose to wash your hair and body, wash as usual with your normal shampoo and body-wash/soap.  3. Rinse your hair and body thoroughly to remove the shampoo and soap.  4. Apply CHG  directly to the skin (ONLY FROM THE NECK DOWN) and wash gently with a scrungie or a clean washcloth.   5. Do not use on open wounds or open sores. Avoid contact with your eyes, ears, mouth and genitals (private parts). Wash Face and genitals (private parts)  with your normal soap.   6. Wash thoroughly, paying special attention to the area where your surgery will be performed.  7. Thoroughly rinse your body with warm water from the neck down.  8. DO NOT shower/wash with your normal soap after using and rinsing off the CHG Soap.  9. Pat yourself dry with a CLEAN  TOWEL.  10. Wear CLEAN PAJAMAS to bed the night before surgery  11. Place CLEAN SHEETS on your bed the night of your first shower and DO NOT SLEEP WITH PETS.  12. Wear comfortable clothes the morning of surgery.     Day of Surgery:  Please shower the morning of surgery with the CHG soap Do not apply any deodorants/lotions. Please wear clean clothes to the hospital/surgery center.   Remember to brush your teeth WITH YOUR REGULAR TOOTHPASTE.   Please read over the following fact sheets that you were given.

## 2020-05-08 NOTE — Progress Notes (Signed)
PCP - Christen Butter NP Cardiologist - denies  Chest x-ray - 02/15/20 EKG - 02/11/20 Stress Test - 02/14/20 ECHO - 02/12/20 Cardiac Cath - denies  ERAS Protcol - yes  PRE-SURGERY Ensure or G2- ensure  COVID TEST- 05/08/20   Anesthesia review: n/a  Patient denies shortness of breath, fever, cough and chest pain at PAT appointment   All instructions explained to the patient, with a verbal understanding of the material. Patient agrees to go over the instructions while at home for a better understanding. Patient also instructed to self quarantine after being tested for COVID-19. The opportunity to ask questions was provided.

## 2020-05-09 ENCOUNTER — Telehealth: Payer: Self-pay | Admitting: Physician Assistant

## 2020-05-09 ENCOUNTER — Telehealth: Payer: Self-pay

## 2020-05-09 ENCOUNTER — Other Ambulatory Visit: Payer: Self-pay | Admitting: Physician Assistant

## 2020-05-09 LAB — SARS CORONAVIRUS 2 (TAT 6-24 HRS): SARS Coronavirus 2: NEGATIVE

## 2020-05-09 MED ORDER — METHOCARBAMOL 500 MG PO TABS: 500.0000 mg | ORAL_TABLET | Freq: Two times a day (BID) | ORAL | 0 refills | Status: DC | PRN

## 2020-05-09 MED ORDER — CIPROFLOXACIN HCL 500 MG PO TABS: 500.0000 mg | ORAL_TABLET | Freq: Two times a day (BID) | ORAL | 0 refills | Status: AC

## 2020-05-09 MED ORDER — TRANEXAMIC ACID 1000 MG/10ML IV SOLN
2000.0000 mg | INTRAVENOUS | Status: AC
  Administered 2020-05-12: 2000 mg via TOPICAL
  Filled 2020-05-09: qty 20

## 2020-05-09 MED ORDER — ONDANSETRON HCL 4 MG PO TABS: 4.0000 mg | ORAL_TABLET | Freq: Three times a day (TID) | ORAL | 0 refills | Status: DC | PRN

## 2020-05-09 MED ORDER — OXYCODONE-ACETAMINOPHEN 5-325 MG PO TABS: 1.0000 | ORAL_TABLET | Freq: Four times a day (QID) | ORAL | 0 refills | Status: DC | PRN

## 2020-05-09 MED ORDER — ASPIRIN EC 81 MG PO TBEC: 81.0000 mg | DELAYED_RELEASE_TABLET | Freq: Two times a day (BID) | ORAL | 0 refills | Status: DC

## 2020-05-09 NOTE — Anesthesia Preprocedure Evaluation (Deleted)
Anesthesia Evaluation    Airway        Dental   Pulmonary           Cardiovascular hypertension,      Neuro/Psych    GI/Hepatic   Endo/Other    Renal/GU      Musculoskeletal   Abdominal   Peds  Hematology   Anesthesia Other Findings   Reproductive/Obstetrics                             Anesthesia Physical Anesthesia Plan  ASA:   Anesthesia Plan:    Post-op Pain Management:    Induction:   PONV Risk Score and Plan:   Airway Management Planned:   Additional Equipment:   Intra-op Plan:   Post-operative Plan:   Informed Consent:   Plan Discussed with:   Anesthesia Plan Comments: (PAT note by Antionette Poles, PA-C: Pt recently evaluated by cardiology December 2021 for intermittent chest discomfort, nonsustained Vtach on ZIO monitor.  Stress test 02/14/20 no ischemia, low risk study.  Echo 02/12/2020 with EF 60-65%, no valvular problems.   She underwent left total hip arthroplasty 02/19/2020 without complication.  Preop labs reviewed, notable for anemia with hemoglobin 9.9.  Not significantly different than previous preop labs on 02/15/2020 which showed hemoglobin 10.7 at that time.  Remainder of labs unremarkable.  Results were called to Dr. Warren Danes office.  EKG 02/11/2020: NSR.  Rate 83.  Stress Test 02/14/2020 The left ventricular ejection fraction is hyperdynamic (>65%). Nuclear stress EF: 67%. There was no ST segment deviation noted during stress. The study is normal. This is a low risk study.  No ischemia or infarction on perfusion images.   Echo 02/12/2020 IMPRESSIONS: 1. Left ventricular ejection fraction, by estimation, is 60 to 65%. The  left ventricle has normal function. The left ventricle has no regional  wall motion abnormalities. Left ventricular diastolic parameters were  normal.  2. Right ventricular systolic function is normal. The right ventricular  size  is normal.  3. The mitral valve is normal in structure. Trivial mitral valve  regurgitation. No evidence of mitral stenosis.  4. The aortic valve is normal in structure. Aortic valve regurgitation is  not visualized. No aortic stenosis is present.  5. The inferior vena cava is normal in size with greater than 50%  respiratory variability, suggesting right atrial pressure of 3 mmHg.  )        Anesthesia Quick Evaluation

## 2020-05-09 NOTE — Progress Notes (Signed)
Anesthesia Chart Review:  Pt recently evaluated by cardiology December 2021 for intermittent chest discomfort, nonsustained Vtach on ZIO monitor.  Stress test 02/14/20 no ischemia, low risk study.  Echo 02/12/2020 with EF 60-65%, no valvular problems.   She underwent left total hip arthroplasty 02/19/2020 without complication.  Preop labs reviewed, notable for anemia with hemoglobin 9.9.  Not significantly different than previous preop labs on 02/15/2020 which showed hemoglobin 10.7 at that time.  Remainder of labs unremarkable.  Results were called to Dr. Warren Danes office.  EKG 02/11/2020: NSR.  Rate 83.  Stress Test 02/14/2020  The left ventricular ejection fraction is hyperdynamic (>65%).  Nuclear stress EF: 67%.  There was no ST segment deviation noted during stress.  The study is normal.  This is a low risk study.  No ischemia or infarction on perfusion images.   Echo 02/12/2020 IMPRESSIONS: 1. Left ventricular ejection fraction, by estimation, is 60 to 65%. The  left ventricle has normal function. The left ventricle has no regional  wall motion abnormalities. Left ventricular diastolic parameters were  normal.  2. Right ventricular systolic function is normal. The right ventricular  size is normal.  3. The mitral valve is normal in structure. Trivial mitral valve  regurgitation. No evidence of mitral stenosis.  4. The aortic valve is normal in structure. Aortic valve regurgitation is  not visualized. No aortic stenosis is present.  5. The inferior vena cava is normal in size with greater than 50%  respiratory variability, suggesting right atrial pressure of 3 mmHg.    Zannie Cove [facility]/Anesthesiology Phone [phone] 05/09/2020 9:52 AM

## 2020-05-09 NOTE — Telephone Encounter (Signed)
Patient returned call. I read note from [place] to her. Patient said she will go pick up her medication now.

## 2020-05-09 NOTE — Progress Notes (Signed)
Has uti.  Please let her know I sent in meds to start asap

## 2020-05-09 NOTE — Telephone Encounter (Signed)
Albertina Parr, RMA  05/09/2020 10:22 AM EST      Called patient no answer LMOM to return our call.   Cristie Hem, PA-C  05/09/2020 9:43 AM EST      Has uti. Please let her know I sent in meds to start asap

## 2020-05-11 NOTE — Anesthesia Preprocedure Evaluation (Addendum)
Anesthesia Evaluation  Patient identified by MRN, date of birth, ID band Patient awake    Reviewed: Allergy & Precautions, H&P , NPO status , Patient's Chart, lab work & pertinent test results  History of Anesthesia Complications (+) PONV and history of anesthetic complications  Airway Mallampati: II  TM Distance: >3 FB Neck ROM: Full    Dental  (+) Missing, Dental Advisory Given   Pulmonary asthma ,    Pulmonary exam normal        Cardiovascular hypertension, Pt. on medications Normal cardiovascular exam     Neuro/Psych negative neurological ROS     GI/Hepatic negative GI ROS, Neg liver ROS,   Endo/Other  negative endocrine ROS  Renal/GU negative Renal ROS     Musculoskeletal  (+) Arthritis ,   Abdominal   Peds  Hematology negative hematology ROS (+)   Anesthesia Other Findings   Reproductive/Obstetrics                            Anesthesia Physical  Anesthesia Plan  ASA: II  Anesthesia Plan: Spinal and MAC   Post-op Pain Management:    Induction: Intravenous  PONV Risk Score and Plan: 3 and Propofol infusion, Treatment may vary due to age or medical condition, Ondansetron and Midazolam  Airway Management Planned: Simple Face Mask  Additional Equipment:   Intra-op Plan:   Post-operative Plan:   Informed Consent: I have reviewed the patients History and Physical, chart, labs and discussed the procedure including the risks, benefits and alternatives for the proposed anesthesia with the patient or authorized representative who has indicated his/her understanding and acceptance.       Plan Discussed with: CRNA, Anesthesiologist and Surgeon  Anesthesia Plan Comments: ( PAT note by Karoline Caldwell, PA-C: Pt recently evaluated by cardiology December 2021 for intermittent chest discomfort, nonsustained Vtach on ZIO monitor.  Stress test 02/14/20 no ischemia, low risk study.   Echo 02/12/2020 with EF 60-65%, no valvular problems.   She underwent left total hip arthroplasty 73/42/8768 without complication.  Preop labs reviewed, notable for anemia with hemoglobin 9.9.  Not significantly different than previous preop labs on 02/15/2020 which showed hemoglobin 10.7 at that time.  Remainder of labs unremarkable.  Results were called to Dr. Phoebe Sharps office.  EKG 02/11/2020: NSR.  Rate 83.  Stress Test 02/14/2020 The left ventricular ejection fraction is hyperdynamic (>65%). Nuclear stress EF: 67%. There was no ST segment deviation noted during stress. The study is normal. This is a low risk study.  No ischemia or infarction on perfusion images.   Echo 02/12/2020 IMPRESSIONS: 1. Left ventricular ejection fraction, by estimation, is 60 to 65%. The  left ventricle has normal function. The left ventricle has no regional  wall motion abnormalities. Left ventricular diastolic parameters were  normal.  2. Right ventricular systolic function is normal. The right ventricular  size is normal.  3. The mitral valve is normal in structure. Trivial mitral valve  regurgitation. No evidence of mitral stenosis.  4. The aortic valve is normal in structure. Aortic valve regurgitation is  not visualized. No aortic stenosis is present.  5. The inferior vena cava is normal in size with greater than 50%  respiratory variability, suggesting right atrial pressure of 3 mmHg.  )        Anesthesia Quick Evaluation

## 2020-05-12 ENCOUNTER — Ambulatory Visit (HOSPITAL_COMMUNITY): Payer: Federal, State, Local not specified - PPO | Admitting: Anesthesiology

## 2020-05-12 ENCOUNTER — Other Ambulatory Visit: Payer: Self-pay

## 2020-05-12 ENCOUNTER — Encounter (HOSPITAL_COMMUNITY): Payer: Self-pay | Admitting: Orthopaedic Surgery

## 2020-05-12 ENCOUNTER — Encounter (HOSPITAL_COMMUNITY)
Admission: RE | Disposition: A | Discharge status: Home / Self Care | Payer: Self-pay | Source: Home / Self Care | Attending: Orthopaedic Surgery

## 2020-05-12 ENCOUNTER — Observation Stay (HOSPITAL_COMMUNITY): Payer: Federal, State, Local not specified - PPO

## 2020-05-12 ENCOUNTER — Observation Stay (HOSPITAL_COMMUNITY)
Admission: RE | Admit: 2020-05-12 | Discharge: 2020-05-13 | Disposition: A | Discharge status: Home / Self Care | Payer: Federal, State, Local not specified - PPO | Attending: Orthopaedic Surgery | Admitting: Orthopaedic Surgery

## 2020-05-12 ENCOUNTER — Ambulatory Visit (HOSPITAL_COMMUNITY): Payer: Federal, State, Local not specified - PPO

## 2020-05-12 ENCOUNTER — Ambulatory Visit (HOSPITAL_COMMUNITY): Payer: Federal, State, Local not specified - PPO | Admitting: Physician Assistant

## 2020-05-12 DIAGNOSIS — Z419 Encounter for procedure for purposes other than remedying health state, unspecified: Secondary | ICD-10-CM

## 2020-05-12 DIAGNOSIS — I10 Essential (primary) hypertension: Secondary | ICD-10-CM | POA: Insufficient documentation

## 2020-05-12 DIAGNOSIS — Z7982 Long term (current) use of aspirin: Secondary | ICD-10-CM | POA: Insufficient documentation

## 2020-05-12 DIAGNOSIS — J45909 Unspecified asthma, uncomplicated: Secondary | ICD-10-CM | POA: Diagnosis not present

## 2020-05-12 DIAGNOSIS — Z79899 Other long term (current) drug therapy: Secondary | ICD-10-CM | POA: Diagnosis not present

## 2020-05-12 DIAGNOSIS — Z96641 Presence of right artificial hip joint: Secondary | ICD-10-CM

## 2020-05-12 DIAGNOSIS — Z471 Aftercare following joint replacement surgery: Secondary | ICD-10-CM | POA: Diagnosis not present

## 2020-05-12 DIAGNOSIS — Z96649 Presence of unspecified artificial hip joint: Secondary | ICD-10-CM

## 2020-05-12 DIAGNOSIS — M1611 Unilateral primary osteoarthritis, right hip: Secondary | ICD-10-CM | POA: Diagnosis not present

## 2020-05-12 HISTORY — DX: Presence of right artificial hip joint: Z96.641

## 2020-05-12 HISTORY — PX: TOTAL HIP ARTHROPLASTY: SHX124

## 2020-05-12 SURGERY — ARTHROPLASTY, HIP, TOTAL, ANTERIOR APPROACH
Anesthesia: Monitor Anesthesia Care | Site: Hip | Laterality: Right

## 2020-05-12 MED ORDER — BUPIVACAINE LIPOSOME 1.3 % IJ SUSP
20.0000 mL | INTRAMUSCULAR | Status: DC
  Filled 2020-05-12: qty 20

## 2020-05-12 MED ORDER — MIDAZOLAM HCL 2 MG/2ML IJ SOLN
INTRAMUSCULAR | Status: DC | PRN
  Administered 2020-05-12: 2 mg via INTRAVENOUS

## 2020-05-12 MED ORDER — ASPIRIN 81 MG PO CHEW
81.0000 mg | CHEWABLE_TABLET | Freq: Two times a day (BID) | ORAL | Status: DC
  Administered 2020-05-13: 81 mg via ORAL
  Filled 2020-05-12: qty 1

## 2020-05-12 MED ORDER — BUPIVACAINE IN DEXTROSE 0.75-8.25 % IT SOLN
INTRATHECAL | Status: DC | PRN
  Administered 2020-05-12: 1.8 mL via INTRATHECAL

## 2020-05-12 MED ORDER — KETOROLAC TROMETHAMINE 30 MG/ML IJ SOLN
INTRAMUSCULAR | Status: AC
  Filled 2020-05-12: qty 1

## 2020-05-12 MED ORDER — ALUM & MAG HYDROXIDE-SIMETH 200-200-20 MG/5ML PO SUSP
30.0000 mL | ORAL | Status: DC | PRN
  Administered 2020-05-13: 30 mL via ORAL
  Filled 2020-05-12: qty 30

## 2020-05-12 MED ORDER — LOSARTAN POTASSIUM 50 MG PO TABS
50.0000 mg | ORAL_TABLET | Freq: Every day | ORAL | Status: DC
  Administered 2020-05-12 – 2020-05-13 (×2): 50 mg via ORAL
  Filled 2020-05-12 (×2): qty 1

## 2020-05-12 MED ORDER — HYDROMORPHONE HCL 1 MG/ML IJ SOLN
INTRAMUSCULAR | Status: AC
  Filled 2020-05-12: qty 1

## 2020-05-12 MED ORDER — METOCLOPRAMIDE HCL 5 MG PO TABS: 5.0000 mg | ORAL_TABLET | Freq: Three times a day (TID) | ORAL | Status: DC | PRN

## 2020-05-12 MED ORDER — MIDAZOLAM HCL 2 MG/2ML IJ SOLN
INTRAMUSCULAR | Status: AC
  Filled 2020-05-12: qty 2

## 2020-05-12 MED ORDER — KETOROLAC TROMETHAMINE 15 MG/ML IJ SOLN
INTRAMUSCULAR | Status: AC
  Filled 2020-05-12: qty 1

## 2020-05-12 MED ORDER — HYDROMORPHONE HCL 1 MG/ML IJ SOLN
0.5000 mg | INTRAMUSCULAR | Status: DC | PRN
  Administered 2020-05-12: 0.5 mg via INTRAVENOUS
  Administered 2020-05-12: 1 mg via INTRAVENOUS
  Filled 2020-05-12: qty 1

## 2020-05-12 MED ORDER — LIDOCAINE 2% (20 MG/ML) 5 ML SYRINGE
INTRAMUSCULAR | Status: AC
  Filled 2020-05-12: qty 5

## 2020-05-12 MED ORDER — CIPROFLOXACIN HCL 500 MG PO TABS: 500.0000 mg | ORAL_TABLET | Freq: Two times a day (BID) | ORAL | Status: DC

## 2020-05-12 MED ORDER — FENTANYL CITRATE (PF) 100 MCG/2ML IJ SOLN: 25.0000 ug | INTRAMUSCULAR | Status: DC | PRN

## 2020-05-12 MED ORDER — CEFAZOLIN SODIUM-DEXTROSE 2-4 GM/100ML-% IV SOLN
2.0000 g | INTRAVENOUS | Status: AC
  Administered 2020-05-12: 2 g via INTRAVENOUS
  Filled 2020-05-12: qty 100

## 2020-05-12 MED ORDER — DOCUSATE SODIUM 100 MG PO CAPS
100.0000 mg | ORAL_CAPSULE | Freq: Two times a day (BID) | ORAL | Status: DC
  Administered 2020-05-12 – 2020-05-13 (×3): 100 mg via ORAL
  Filled 2020-05-12 (×3): qty 1

## 2020-05-12 MED ORDER — OXYCODONE HCL ER 10 MG PO T12A
10.0000 mg | EXTENDED_RELEASE_TABLET | Freq: Two times a day (BID) | ORAL | Status: DC
Start: 2020-05-12 — End: 2020-05-13
  Administered 2020-05-12 – 2020-05-13 (×3): 10 mg via ORAL
  Filled 2020-05-12 (×3): qty 1

## 2020-05-12 MED ORDER — POVIDONE-IODINE 10 % EX SWAB: 2.0000 "application " | Freq: Once | CUTANEOUS | Status: DC

## 2020-05-12 MED ORDER — METHOCARBAMOL 500 MG PO TABS
ORAL_TABLET | ORAL | Status: AC
  Filled 2020-05-12: qty 1

## 2020-05-12 MED ORDER — KETOROLAC TROMETHAMINE 15 MG/ML IJ SOLN
15.0000 mg | Freq: Four times a day (QID) | INTRAMUSCULAR | Status: AC
  Administered 2020-05-12 – 2020-05-13 (×4): 15 mg via INTRAVENOUS
  Filled 2020-05-12 (×3): qty 1

## 2020-05-12 MED ORDER — TRANEXAMIC ACID-NACL 1000-0.7 MG/100ML-% IV SOLN
1000.0000 mg | Freq: Once | INTRAVENOUS | Status: AC
  Administered 2020-05-12: 1000 mg via INTRAVENOUS
  Filled 2020-05-12: qty 100

## 2020-05-12 MED ORDER — PROPOFOL 500 MG/50ML IV EMUL
INTRAVENOUS | Status: DC | PRN
  Administered 2020-05-12: 25 ug/kg/min via INTRAVENOUS

## 2020-05-12 MED ORDER — DIPHENHYDRAMINE HCL 12.5 MG/5ML PO ELIX: 25.0000 mg | ORAL_SOLUTION | ORAL | Status: DC | PRN

## 2020-05-12 MED ORDER — POLYETHYLENE GLYCOL 3350 17 G PO PACK
17.0000 g | PACK | Freq: Every day | ORAL | Status: DC
  Administered 2020-05-13: 17 g via ORAL
  Filled 2020-05-12 (×2): qty 1

## 2020-05-12 MED ORDER — PROPOFOL 1000 MG/100ML IV EMUL
INTRAVENOUS | Status: AC
  Filled 2020-05-12: qty 100

## 2020-05-12 MED ORDER — DEXAMETHASONE SODIUM PHOSPHATE 10 MG/ML IJ SOLN
10.0000 mg | Freq: Once | INTRAMUSCULAR | Status: AC
  Administered 2020-05-13: 10 mg via INTRAVENOUS
  Filled 2020-05-12: qty 1

## 2020-05-12 MED ORDER — AMISULPRIDE (ANTIEMETIC) 5 MG/2ML IV SOLN: 10.0000 mg | Freq: Once | INTRAVENOUS | Status: DC | PRN

## 2020-05-12 MED ORDER — IRRISEPT - 450ML BOTTLE WITH 0.05% CHG IN STERILE WATER, USP 99.95% OPTIME
TOPICAL | Status: DC | PRN
  Administered 2020-05-12: 450 mL via TOPICAL

## 2020-05-12 MED ORDER — CELECOXIB 200 MG PO CAPS
200.0000 mg | ORAL_CAPSULE | Freq: Once | ORAL | Status: AC
  Administered 2020-05-12: 200 mg via ORAL
  Filled 2020-05-12: qty 1

## 2020-05-12 MED ORDER — ACETAMINOPHEN 500 MG PO TABS
1000.0000 mg | ORAL_TABLET | Freq: Once | ORAL | Status: AC
  Administered 2020-05-12: 1000 mg via ORAL
  Filled 2020-05-12: qty 2

## 2020-05-12 MED ORDER — VANCOMYCIN HCL 1000 MG IV SOLR
INTRAVENOUS | Status: AC
  Filled 2020-05-12: qty 1000

## 2020-05-12 MED ORDER — PROPOFOL 10 MG/ML IV BOLUS
INTRAVENOUS | Status: DC | PRN
  Administered 2020-05-12 (×3): 20 mg via INTRAVENOUS

## 2020-05-12 MED ORDER — METHOCARBAMOL 1000 MG/10ML IJ SOLN
500.0000 mg | Freq: Four times a day (QID) | INTRAVENOUS | Status: DC | PRN
  Filled 2020-05-12: qty 5

## 2020-05-12 MED ORDER — CEFAZOLIN SODIUM-DEXTROSE 2-4 GM/100ML-% IV SOLN
2.0000 g | Freq: Four times a day (QID) | INTRAVENOUS | Status: AC
  Administered 2020-05-12 (×2): 2 g via INTRAVENOUS
  Filled 2020-05-12 (×2): qty 100

## 2020-05-12 MED ORDER — ONDANSETRON HCL 4 MG/2ML IJ SOLN: 4.0000 mg | Freq: Four times a day (QID) | INTRAMUSCULAR | Status: DC | PRN

## 2020-05-12 MED ORDER — LACTATED RINGERS IV SOLN: INTRAVENOUS | Status: DC

## 2020-05-12 MED ORDER — ACETAMINOPHEN 325 MG PO TABS: 325.0000 mg | ORAL_TABLET | Freq: Four times a day (QID) | ORAL | Status: DC | PRN

## 2020-05-12 MED ORDER — SODIUM CHLORIDE 0.9 % IR SOLN
Status: DC | PRN
  Administered 2020-05-12: 3000 mL

## 2020-05-12 MED ORDER — TRANEXAMIC ACID-NACL 1000-0.7 MG/100ML-% IV SOLN
1000.0000 mg | INTRAVENOUS | Status: AC
  Administered 2020-05-12: 1000 mg via INTRAVENOUS
  Filled 2020-05-12: qty 100

## 2020-05-12 MED ORDER — PROPOFOL 10 MG/ML IV BOLUS
INTRAVENOUS | Status: AC
  Filled 2020-05-12: qty 20

## 2020-05-12 MED ORDER — ACETAMINOPHEN 500 MG PO TABS
1000.0000 mg | ORAL_TABLET | Freq: Four times a day (QID) | ORAL | Status: AC
  Administered 2020-05-12 – 2020-05-13 (×4): 1000 mg via ORAL
  Filled 2020-05-12 (×4): qty 2

## 2020-05-12 MED ORDER — VANCOMYCIN HCL 1 G IV SOLR
INTRAVENOUS | Status: DC | PRN
  Administered 2020-05-12: 1000 mg via TOPICAL

## 2020-05-12 MED ORDER — TRANEXAMIC ACID-NACL 1000-0.7 MG/100ML-% IV SOLN
INTRAVENOUS | Status: AC
  Filled 2020-05-12: qty 100

## 2020-05-12 MED ORDER — OXYCODONE HCL 5 MG PO TABS
5.0000 mg | ORAL_TABLET | ORAL | Status: DC | PRN
  Administered 2020-05-12 – 2020-05-13 (×3): 10 mg via ORAL
  Filled 2020-05-12 (×2): qty 2

## 2020-05-12 MED ORDER — SODIUM CHLORIDE 0.9 % IV SOLN: INTRAVENOUS | Status: DC

## 2020-05-12 MED ORDER — MENTHOL 3 MG MT LOZG: 1.0000 | LOZENGE | OROMUCOSAL | Status: DC | PRN

## 2020-05-12 MED ORDER — ONDANSETRON HCL 4 MG/2ML IJ SOLN
INTRAMUSCULAR | Status: DC | PRN
  Administered 2020-05-12: 4 mg via INTRAVENOUS

## 2020-05-12 MED ORDER — METOCLOPRAMIDE HCL 5 MG/ML IJ SOLN: 5.0000 mg | Freq: Three times a day (TID) | INTRAMUSCULAR | Status: DC | PRN

## 2020-05-12 MED ORDER — PHENOL 1.4 % MT LIQD: 1.0000 | OROMUCOSAL | Status: DC | PRN

## 2020-05-12 MED ORDER — PHENYLEPHRINE HCL-NACL 10-0.9 MG/250ML-% IV SOLN
INTRAVENOUS | Status: DC | PRN
  Administered 2020-05-12: 50 ug/min via INTRAVENOUS

## 2020-05-12 MED ORDER — METHOCARBAMOL 500 MG PO TABS
500.0000 mg | ORAL_TABLET | Freq: Four times a day (QID) | ORAL | Status: DC | PRN
  Administered 2020-05-12 – 2020-05-13 (×3): 500 mg via ORAL
  Filled 2020-05-12 (×2): qty 1

## 2020-05-12 MED ORDER — CHLORHEXIDINE GLUCONATE 0.12 % MT SOLN
OROMUCOSAL | Status: AC
  Filled 2020-05-12: qty 15

## 2020-05-12 MED ORDER — BUPIVACAINE-EPINEPHRINE (PF) 0.25% -1:200000 IJ SOLN
INTRAMUSCULAR | Status: AC
  Filled 2020-05-12: qty 30

## 2020-05-12 MED ORDER — SORBITOL 70 % SOLN
30.0000 mL | Freq: Every day | Status: DC | PRN
  Filled 2020-05-12: qty 30

## 2020-05-12 MED ORDER — SODIUM CHLORIDE 0.9 % IV SOLN: INTRAVENOUS | Status: DC | PRN

## 2020-05-12 MED ORDER — ONDANSETRON HCL 4 MG PO TABS
4.0000 mg | ORAL_TABLET | Freq: Four times a day (QID) | ORAL | Status: DC | PRN
  Administered 2020-05-13: 4 mg via ORAL
  Filled 2020-05-12: qty 1

## 2020-05-12 MED ORDER — MAGNESIUM CITRATE PO SOLN: 1.0000 | Freq: Once | ORAL | Status: DC | PRN

## 2020-05-12 MED ORDER — 0.9 % SODIUM CHLORIDE (POUR BTL) OPTIME
TOPICAL | Status: DC | PRN
  Administered 2020-05-12: 1000 mL

## 2020-05-12 MED ORDER — OXYCODONE HCL 5 MG PO TABS
10.0000 mg | ORAL_TABLET | ORAL | Status: DC | PRN
  Filled 2020-05-12: qty 2

## 2020-05-12 MED ORDER — HYDROXYZINE HCL 50 MG/ML IM SOLN
50.0000 mg | Freq: Four times a day (QID) | INTRAMUSCULAR | Status: DC | PRN
  Administered 2020-05-12: 50 mg via INTRAMUSCULAR
  Filled 2020-05-12: qty 1

## 2020-05-12 MED ORDER — BACLOFEN 10 MG PO TABS: 10.0000 mg | ORAL_TABLET | Freq: Every evening | ORAL | Status: DC | PRN

## 2020-05-12 SURGICAL SUPPLY — 60 items
ACETAB CUP W/GRIPTION 54 (Plate) ×2 IMPLANT
BAG DECANTER FOR FLEXI CONT (MISCELLANEOUS) ×2 IMPLANT
COVER PERINEAL POST (MISCELLANEOUS) ×2 IMPLANT
COVER SURGICAL LIGHT HANDLE (MISCELLANEOUS) ×2 IMPLANT
COVER WAND RF STERILE (DRAPES) ×2 IMPLANT
CUP ACETAB W/GRIPTION 54 (Plate) ×1 IMPLANT
DERMABOND ADVANCED (GAUZE/BANDAGES/DRESSINGS) ×1
DERMABOND ADVANCED .7 DNX12 (GAUZE/BANDAGES/DRESSINGS) ×1 IMPLANT
DRAPE C-ARM 42X72 X-RAY (DRAPES) ×2 IMPLANT
DRAPE POUCH INSTRU U-SHP 10X18 (DRAPES) ×2 IMPLANT
DRAPE STERI IOBAN 125X83 (DRAPES) ×2 IMPLANT
DRAPE U-SHAPE 47X51 STRL (DRAPES) ×4 IMPLANT
DRSG AQUACEL AG ADV 3.5X10 (GAUZE/BANDAGES/DRESSINGS) ×2 IMPLANT
DURAPREP 26ML APPLICATOR (WOUND CARE) ×4 IMPLANT
ELECT BLADE 4.0 EZ CLEAN MEGAD (MISCELLANEOUS) ×2
ELECT REM PT RETURN 9FT ADLT (ELECTROSURGICAL) ×2
ELECTRODE BLDE 4.0 EZ CLN MEGD (MISCELLANEOUS) ×1 IMPLANT
ELECTRODE REM PT RTRN 9FT ADLT (ELECTROSURGICAL) ×1 IMPLANT
GLOVE ECLIPSE 7.0 STRL STRAW (GLOVE) ×4 IMPLANT
GLOVE SKINSENSE NS SZ7.5 (GLOVE) ×1
GLOVE SKINSENSE STRL SZ7.5 (GLOVE) ×1 IMPLANT
GLOVE SURG SYN 7.5  E (GLOVE) ×4
GLOVE SURG SYN 7.5 E (GLOVE) ×4 IMPLANT
GLOVE SURG UNDER POLY LF SZ7 (GLOVE) ×2 IMPLANT
GOWN STRL REIN XL XLG (GOWN DISPOSABLE) ×2 IMPLANT
GOWN STRL REUS W/ TWL LRG LVL3 (GOWN DISPOSABLE) ×2 IMPLANT
GOWN STRL REUS W/ TWL XL LVL3 (GOWN DISPOSABLE) ×1 IMPLANT
GOWN STRL REUS W/TWL LRG LVL3 (GOWN DISPOSABLE) ×2
GOWN STRL REUS W/TWL XL LVL3 (GOWN DISPOSABLE) ×1
HANDPIECE INTERPULSE COAX TIP (DISPOSABLE) ×1
HEAD CERAMIC DELTA 36 PLUS 1.5 (Hips) ×2 IMPLANT
HOOD PEEL AWAY FLYTE STAYCOOL (MISCELLANEOUS) ×4 IMPLANT
IV NS IRRIG 3000ML ARTHROMATIC (IV SOLUTION) ×2 IMPLANT
JET LAVAGE IRRISEPT WOUND (IRRIGATION / IRRIGATOR) ×2
KIT BASIN OR (CUSTOM PROCEDURE TRAY) ×2 IMPLANT
LAVAGE JET IRRISEPT WOUND (IRRIGATION / IRRIGATOR) ×1 IMPLANT
LINER NEUTRAL 54X36MM PLUS 4 (Hips) ×2 IMPLANT
MARKER SKIN DUAL TIP RULER LAB (MISCELLANEOUS) ×2 IMPLANT
NEEDLE BLUNT 18X1 FOR OR ONLY (NEEDLE) ×2 IMPLANT
NEEDLE SPNL 18GX3.5 QUINCKE PK (NEEDLE) ×2 IMPLANT
PACK TOTAL JOINT (CUSTOM PROCEDURE TRAY) ×2 IMPLANT
PACK UNIVERSAL I (CUSTOM PROCEDURE TRAY) ×2 IMPLANT
SAW OSC TIP CART 19.5X105X1.3 (SAW) ×2 IMPLANT
SCREW 6.5MMX25MM (Screw) ×2 IMPLANT
SET HNDPC FAN SPRY TIP SCT (DISPOSABLE) ×1 IMPLANT
STEM FEMORAL SZ 6MM STD ACTIS (Stem) ×2 IMPLANT
SUT ETHIBOND 2 V 37 (SUTURE) ×2 IMPLANT
SUT ETHILON 2 0 FS 18 (SUTURE) ×6 IMPLANT
SUT VIC AB 0 CT1 27 (SUTURE) ×1
SUT VIC AB 0 CT1 27XBRD ANBCTR (SUTURE) ×1 IMPLANT
SUT VIC AB 1 CTX 36 (SUTURE) ×2
SUT VIC AB 1 CTX36XBRD ANBCTR (SUTURE) ×2 IMPLANT
SUT VIC AB 2-0 CT1 27 (SUTURE) ×1
SUT VIC AB 2-0 CT1 TAPERPNT 27 (SUTURE) ×1 IMPLANT
SYR 50ML LL SCALE MARK (SYRINGE) ×2 IMPLANT
SYR 50ML SLIP (SYRINGE) ×2 IMPLANT
TOWEL GREEN STERILE (TOWEL DISPOSABLE) ×2 IMPLANT
TRAY FOLEY W/BAG SLVR 16FR (SET/KITS/TRAYS/PACK) ×1
TRAY FOLEY W/BAG SLVR 16FR ST (SET/KITS/TRAYS/PACK) ×1 IMPLANT
YANKAUER SUCT BULB TIP NO VENT (SUCTIONS) ×2 IMPLANT

## 2020-05-12 NOTE — Anesthesia Postprocedure Evaluation (Signed)
Anesthesia Post Note  Patient: Toni Jones  Procedure(s) Performed: RIGHT TOTAL HIP ARTHROPLASTY ANTERIOR APPROACH (Right Hip)     Patient location during evaluation: PACU Anesthesia Type: MAC and Spinal Level of consciousness: awake and alert Pain management: pain level controlled Vital Signs Assessment: post-procedure vital signs reviewed and stable Respiratory status: spontaneous breathing and respiratory function stable Cardiovascular status: blood pressure returned to baseline and stable Postop Assessment: spinal receding Anesthetic complications: no   No complications documented.  Last Vitals:  Vitals:   05/12/20 1017 05/12/20 1032  BP: 135/77 (!) 148/67  Pulse: 67 61  Resp: 15 13  Temp:    SpO2: 100% 100%    Last Pain:  Vitals:   05/12/20 0947  TempSrc:   PainSc: 3                  Doak Mah DANIEL

## 2020-05-12 NOTE — Discharge Instructions (Signed)

## 2020-05-12 NOTE — Anesthesia Procedure Notes (Addendum)
Spinal  Patient location during procedure: OR Start time: 05/12/2020 7:16 AM End time: 05/12/2020 7:26 AM Staffing Performed: anesthesiologist  Anesthesiologist: Heather Roberts, MD Preanesthetic Checklist Completed: patient identified, IV checked, risks and benefits discussed, surgical consent, monitors and equipment checked, pre-op evaluation and timeout performed Spinal Block Patient position: sitting Prep: DuraPrep Patient monitoring: cardiac monitor, continuous pulse ox and blood pressure Approach: midline Location: L2-3 Injection technique: single-shot Needle Needle type: Pencan  Needle gauge: 24 G Needle length: 9 cm Additional Notes Functioning IV was confirmed and monitors were applied. Sterile prep and drape, including hand hygiene and sterile gloves were used. The patient was positioned and the spine was prepped. The skin was anesthetized with lidocaine.  Free flow of clear CSF was obtained prior to injecting local anesthetic into the CSF.  The spinal needle aspirated freely following injection.  The needle was carefully withdrawn.  The patient tolerated the procedure well.

## 2020-05-12 NOTE — Transfer of Care (Signed)
Immediate Anesthesia Transfer of Care Note  Patient: Nayzeth Altman  Procedure(s) Performed: RIGHT TOTAL HIP ARTHROPLASTY ANTERIOR APPROACH (Right Hip)  Patient Location: PACU  Anesthesia Type:MAC and Spinal  Level of Consciousness: awake, alert  and oriented  Airway & Oxygen Therapy: Patient Spontanous Breathing  Post-op Assessment: Report given to RN and Post -op Vital signs reviewed and stable  Post vital signs: Reviewed and stable  Last Vitals:  Vitals Value Taken Time  BP 103/59 05/12/20 0917  Temp    Pulse 79 05/12/20 0918  Resp 16 05/12/20 0918  SpO2 99 % 05/12/20 0918  Vitals shown include unvalidated device data.  Last Pain:  Vitals:   05/12/20 0558  TempSrc:   PainSc: 3       Patients Stated Pain Goal: 3 (40/45/91 3685)  Complications: No complications documented.

## 2020-05-12 NOTE — H&P (Signed)
PREOPERATIVE H&P  Chief Complaint: right hip degenerative joint disease  HPI: Toni Jones is a 60 y.o. female who presents for surgical treatment of right hip degenerative joint disease.  She denies any changes in medical history.  Past Medical History:  Diagnosis Date  . Arthritis   . Asthma   . Bronchitis   . Chronic bronchitis (HCC)   . Flesh-eating bacteria (HCC)    from the ocean  . Foot fracture, left   . Hypertension   . Pneumonia    hx of   . PONV (postoperative nausea and vomiting)    Past Surgical History:  Procedure Laterality Date  . APPENDECTOMY    . left foot surgery     . MANDIBLE SURGERY    . TONSILLECTOMY    . TOTAL HIP ARTHROPLASTY Left 02/18/2020   Procedure: LEFT TOTAL HIP ARTHROPLASTY ANTERIOR APPROACH;  Surgeon: Tarry Kos, MD;  Location: [facility];  Service: Orthopedics;  Laterality: Left;  . WISDOM TOOTH EXTRACTION     Social History   Socioeconomic History  . Marital status: Divorced    Spouse name: Not on file  . Number of children: Not on file  . Years of education: Not on file  . Highest education level: Not on file  Occupational History  . Not on file  Tobacco Use  . Smoking status: Never Smoker  . Smokeless tobacco: Never Used  Vaping Use  . Vaping Use: Never used  Substance and Sexual Activity  . Alcohol use: Never  . Drug use: Never  . Sexual activity: Not Currently    Partners: Male  Other Topics Concern  . Not on file  Social History Narrative  . Not on file   Social Determinants of Health   Financial Resource Strain: Not on file  Food Insecurity: Not on file  Transportation Needs: Not on file  Physical Activity: Not on file  Stress: Not on file  Social Connections: Not on file   Family History  Adopted: Yes  Problem Relation Age of Onset  . Heart attack Mother   . Cancer Son        osteogenic carcinoma   Allergies  Allergen Reactions  . Onion Other (See Comments)    Raw onion oil Nose bleeds  .  Red Dye #40 [Red Dye] Hives   Prior to Admission medications   Medication Sig Start Date End Date Taking? Authorizing Provider  albuterol (VENTOLIN HFA) 108 (90 Base) MCG/ACT inhaler Inhale 1-2 puffs into the lungs every 6 (six) hours as needed for wheezing or shortness of breath. 08/01/18  Yes Jacalyn Lefevre, MD  aspirin EC 81 MG tablet Take 1 tablet (81 mg total) by mouth 2 (two) times daily. To be taken after surgery 05/09/20   Cristie Hem, PA-C  baclofen (LIORESAL) 10 MG tablet Take 1 tablet (10 mg total) by mouth at bedtime as needed for muscle spasms. Patient taking differently: Take 10 mg by mouth at bedtime. 04/21/20  Yes Christen Butter, NP  CALCIUM-MAGNESIUM-VITAMIN D PO Take 1 tablet by mouth daily. 1000 mg / 500 mg   Yes [provider]  CALCIUM-VITAMIN D PO Take 2 each by mouth daily. Gummies   Yes [provider]  ferrous gluconate (FERGON) 324 MG tablet Take 1 tablet (324 mg total) by mouth daily with breakfast. 01/24/20  Yes Christen Butter, NP  losartan (COZAAR) 50 MG tablet Take 1 tablet (50 mg total) by mouth daily. 04/21/20  Yes Christen Butter, NP  methocarbamol (ROBAXIN) 500 MG tablet Take 1 tablet (500 mg total) by mouth 2 (two) times daily as needed. To be taken after surgery 05/09/20   Cristie Hem, PA-C  Multiple Vitamins-Minerals (MULTIVITAMIN ADULT) CHEW Chew 2 each by mouth daily. Woman 50 +   Yes [provider]  ondansetron (ZOFRAN) 4 MG tablet Take 1 tablet (4 mg total) by mouth every 8 (eight) hours as needed for nausea or vomiting. 05/09/20   Cristie Hem, PA-C  oxyCODONE-acetaminophen (PERCOCET) 5-325 MG tablet Take 1-2 tablets by mouth every 6 (six) hours as needed. To be taken after surgery 05/09/20   Cristie Hem, PA-C  promethazine (PHENERGAN) 12.5 MG tablet Take 1 tablet (12.5 mg total) by mouth daily as needed for nausea or vomiting. Patient taking differently: Take 12.5 mg by mouth at bedtime. 04/21/20  Yes Christen Butter, NP   ciprofloxacin (CIPRO) 500 MG tablet Take 1 tablet (500 mg total) by mouth 2 (two) times daily for 10 days. 05/09/20 05/19/20  Cristie Hem, PA-C  sulfamethoxazole-trimethoprim (BACTRIM DS) 800-160 MG tablet Take 1 tablet by mouth 2 (two) times daily. 04/21/20   Christen Butter, NP     Positive ROS: All other systems have been reviewed and were otherwise negative with the exception of those mentioned in the HPI and as above.  Physical Exam: General: Alert, no acute distress Cardiovascular: No pedal edema Respiratory: No cyanosis, no use of accessory musculature GI: abdomen soft Skin: No lesions in the area of chief complaint Neurologic: Sensation intact distally Psychiatric: Patient is competent for consent with normal mood and affect Lymphatic: no lymphedema  MUSCULOSKELETAL: exam stable  Assessment: right hip degenerative joint disease  Plan: Plan for Procedure(s): RIGHT TOTAL HIP ARTHROPLASTY ANTERIOR APPROACH  The risks benefits and alternatives were discussed with the patient including but not limited to the risks of nonoperative treatment, versus surgical intervention including infection, bleeding, nerve injury,  blood clots, cardiopulmonary complications, morbidity, mortality, among others, and they were willing to proceed.   Preoperative templating of the joint replacement has been completed, documented, and submitted to the Operating Room personnel in order to optimize intra-operative equipment management.   Glee Arvin, MD 05/12/2020 5:48 AM

## 2020-05-12 NOTE — Evaluation (Signed)
Physical Therapy Evaluation Patient Details Name: Toni Jones MRN: [id number] DOB: [birth year] Today's Date: 05/12/2020   History of Present Illness  Pt is a 60 y/o female s/p R THA, direct anterior on 3/7. PMH includes HTN, asthma, and L THA.  Clinical Impression  Pt admitted secondary to problem above with deficits below. Pt requiring min A for bed mobility and min guard for transfers and ambulation using RW. Pt reports she plans to d/c home with assist from friends. Will continue to follow acutely to maximize functional mobility independence and safety.     Follow Up Recommendations Follow surgeon's recommendation for DC plan and follow-up therapies    Equipment Recommendations  None recommended by PT    Recommendations for Other Services       Precautions / Restrictions Precautions Precautions: Fall Restrictions Weight Bearing Restrictions: Yes RLE Weight Bearing: Weight bearing as tolerated      Mobility  Bed Mobility Overal bed mobility: Needs Assistance Bed Mobility: Supine to Sit;Sit to Supine     Supine to sit: Min assist Sit to supine: Min assist   General bed mobility comments: Min A for RLE assist. IV team asking for pt to be in bed after session so returned to supine instead of chair.    Transfers Overall transfer level: Needs assistance Equipment used: Rolling walker (2 wheeled) Transfers: Sit to/from Stand Sit to Stand: Min guard         General transfer comment: Min guard for safety. Demonstrated safe hand placement.  Ambulation/Gait Ambulation/Gait assistance: Min guard Gait Distance (Feet): 100 Feet Assistive device: Rolling walker (2 wheeled) Gait Pattern/deviations: Step-through pattern;Decreased step length - right;Decreased step length - left;Decreased weight shift to right;Antalgic Gait velocity: Decreased   General Gait Details: Mildly antalgic gait. Min guard for safety. Cues for sequencing.  Stairs            Wheelchair  Mobility    Modified Rankin (Stroke Patients Only)       Balance Overall balance assessment: Needs assistance Sitting-balance support: Feet supported;No upper extremity supported Sitting balance-Leahy Scale: Fair     Standing balance support: Bilateral upper extremity supported;During functional activity Standing balance-Leahy Scale: Poor Standing balance comment: Reliant on BUE support                             Pertinent Vitals/Pain Pain Assessment: Faces Faces Pain Scale: Hurts little more Pain Location: R hip Pain Descriptors / Indicators: Aching;Operative site guarding Pain Intervention(s): Limited activity within patient's tolerance;Monitored during session;Repositioned    Home Living Family/patient expects to be discharged to:: Private residence Living Arrangements: Non-relatives/Friends;Other relatives Available Help at Discharge: Friend(s) Type of Home: House Home Access: Level entry     Home Layout: One level Home Equipment: Grab bars - tub/shower;Shower seat - built in;Cane - single point;Walker - 2 wheels      Prior Function Level of Independence: Independent         Comments: enjoys martial arts, biking     Higher education careers adviser        Extremity/Trunk Assessment   Upper Extremity Assessment Upper Extremity Assessment: Overall WFL for tasks assessed    Lower Extremity Assessment Lower Extremity Assessment: RLE deficits/detail RLE Deficits / Details: deficits consistent with post op pain and weakness.    Cervical / Trunk Assessment Cervical / Trunk Assessment: Normal  Communication   Communication: No difficulties  Cognition Arousal/Alertness: Awake/alert Behavior During Therapy: WFL for tasks assessed/performed Overall Cognitive  Status: Within Functional Limits for tasks assessed                                        General Comments      Exercises     Assessment/Plan    PT Assessment Patient needs  continued PT services  PT Problem List Decreased strength;Decreased range of motion;Decreased activity tolerance;Decreased balance;Decreased mobility;Decreased knowledge of use of DME;Decreased knowledge of precautions;Pain       PT Treatment Interventions DME instruction;Gait training;Functional mobility training;Therapeutic activities;Therapeutic exercise;Balance training;Patient/family education    PT Goals (Current goals can be found in the Care Plan section)  Acute Rehab PT Goals Patient Stated Goal: to go ome PT Goal Formulation: With patient Time For Goal Achievement: 05/26/20 Potential to Achieve Goals: Good    Frequency 7X/week   Barriers to discharge        Co-evaluation               AM-PAC PT "6 Clicks" Mobility  Outcome Measure Help needed turning from your back to your side while in a flat bed without using bedrails?: A Little Help needed moving from lying on your back to sitting on the side of a flat bed without using bedrails?: A Little Help needed moving to and from a bed to a chair (including a wheelchair)?: A Little Help needed standing up from a chair using your arms (e.g., wheelchair or bedside chair)?: A Little Help needed to walk in hospital room?: A Little Help needed climbing 3-5 steps with a railing? : A Little 6 Click Score: 18    End of Session Equipment Utilized During Treatment: Gait belt Activity Tolerance: Patient tolerated treatment well Patient left: in bed;with call bell/phone within reach Nurse Communication: Mobility status PT Visit Diagnosis: Difficulty in walking, not elsewhere classified (R26.2);Other abnormalities of gait and mobility (R26.89);Pain Pain - Right/Left: Right Pain - part of body: Hip    Time: 4650-3546 PT Time Calculation (min) (ACUTE ONLY): 22 min   Charges:   PT Evaluation $PT Eval Low Complexity: 1 Low          Cindee Salt, DPT  Acute Rehabilitation Services  Pager: [phone] Office: [phone]   Lehman Prom 05/12/2020, 2:57 PM

## 2020-05-12 NOTE — Progress Notes (Signed)
Patient complaining of pressure in lower back after spinal block. Adonis Huguenin, MD notified- no new orders. Will continue to monitor patient.

## 2020-05-12 NOTE — Op Note (Signed)
RIGHT TOTAL HIP ARTHROPLASTY ANTERIOR APPROACH  Procedure Note Toni Jones   [id number]  Pre-op Diagnosis: right hip degenerative joint disease     Post-op Diagnosis: same   Operative Procedures  1. Total hip replacement; Right hip; uncemented [id number]   Surgeon: Gershon Mussel, M.D.  Assist: Oneal Grout, PA-C   Anesthesia: spinal, exparel  Prosthesis: Depuy Acetabulum: Pinnacle 54 mm Femur: Actis 6 STD Head: 36 mm size: +1.5 Liner: +4 Bearing Type: ceramic on poly  Total Hip Arthroplasty (Anterior Approach) Op Note:  After informed consent was obtained and the operative extremity marked in the holding area, the patient was brought back to the operating room and placed supine on the HANA table. Next, the operative extremity was prepped and draped in normal sterile fashion. Surgical timeout occurred verifying patient identification, surgical site, surgical procedure and administration of antibiotics.  A modified anterior Smith-Peterson approach to the hip was performed, using the interval between tensor fascia lata and sartorius.  Dissection was carried bluntly down onto the anterior hip capsule. The lateral femoral circumflex vessels were identified and coagulated. A capsulotomy was performed and the capsular flaps tagged for later repair.  The neck osteotomy was performed. The femoral head was removed which showed severe wear, the acetabular rim was cleared of soft tissue and attention was turned to reaming the acetabulum.  Sequential reaming was performed under fluoroscopic guidance. We reamed to a size 53 mm, and then impacted the acetabular shell. A 25 mm cancellous screw was placed through the shell for added fixation.  The liner was then placed after irrigation and attention turned to the femur.  After placing the femoral hook, the leg was taken to externally rotated, extended and adducted position taking care to perform soft tissue releases to allow for adequate  mobilization of the femur. Soft tissue was cleared from the shoulder of the greater trochanter and the hook elevator used to improve exposure of the proximal femur. Sequential broaching performed up to a size 6. Trial neck and head were placed. The leg was brought back up to neutral and the construct reduced.  Antibiotic irrigation was placed in the surgical wound and kept for at least 1 minute.  The position and sizing of components, offset and leg lengths were checked using fluoroscopy. Stability of the construct was checked in extension and external rotation without any subluxation or impingement of prosthesis. We dislocated the prosthesis, dropped the leg back into position, removed trial components, and irrigated copiously. The final stem and head was then placed, the leg brought back up, the system reduced and fluoroscopy used to verify positioning.  We irrigated, obtained hemostasis and closed the capsule using #2 ethibond suture.  One gram of vancomycin powder was placed in the surgical bed. A dilute solution of 20 cc of normal saline, 1.3% exparel, 0.25% bupivacaine was injected in the soft tissues.  One gram of topical tranexamic acid was injected into the joint.  The fascia was closed with #1 vicryl plus, the deep fat layer was closed with 0 vicryl, the subcutaneous layers closed with 2.0 Vicryl Plus and the skin closed with 2.0 nylon and dermabond. A sterile dressing was applied. The patient was awakened in the operating room and taken to recovery in stable condition.  All sponge, needle, and instrument counts were correct at the end of the case.   Tessa Lerner, my PA, was a medical necessity for opening, closing, limb positioning, retracting, exposing, and overall facilitation and timely completion of the surgery.  Position: supine  Complications: see description of procedure.  Time Out: performed   Drains/Packing: none  Estimated blood loss: see anesthesia record  Returned to Recovery  Room: in good condition.   Antibiotics: yes   Mechanical VTE (DVT) Prophylaxis: sequential compression devices, TED thigh-high  Chemical VTE (DVT) Prophylaxis: aspirin   Fluid Replacement: see anesthesia record  Specimens Removed: 1 to pathology   Sponge and Instrument Count Correct? yes   PACU: portable radiograph - low AP   Plan/RTC: Return in 2 weeks for staple removal. Weight Bearing/Load Lower Extremity: full  Hip precautions: none Suture Removal: 2 weeks   N. Glee Arvin, MD Aldean Baker 8:38 AM   Implant Name Type Inv. Item Serial No. Manufacturer Lot No. LRB No. Used Action  ACETAB CUP W GRIPTION - [id number] Plate ACETAB CUP W GRIPTION  [employer] [id number] Right 1 Implanted  LINER NEUTRAL 54X36MM PLUS 4 - [id number] Hips LINER NEUTRAL 54X36MM PLUS 4  [employer] [id number] Right 1 Implanted  SCREW 6.5MMX25MM - [id number] Screw SCREW 6.5MMX25MM  [employer] [id number] Right 1 Implanted

## 2020-05-12 NOTE — Anesthesia Procedure Notes (Signed)
Procedure Name: MAC Date/Time: 05/12/2020 7:30 AM Performed by: Rande Brunt, CRNA Pre-anesthesia Checklist: Patient identified, Emergency Drugs available, Suction available and Patient being monitored Patient Re-evaluated:Patient Re-evaluated prior to induction Oxygen Delivery Method: Simple face mask Induction Type: IV induction Placement Confirmation: CO2 detector Dental Injury: Teeth and Oropharynx as per pre-operative assessment

## 2020-05-13 ENCOUNTER — Encounter (HOSPITAL_COMMUNITY): Payer: Self-pay | Admitting: Orthopaedic Surgery

## 2020-05-13 ENCOUNTER — Other Ambulatory Visit: Payer: Self-pay | Admitting: Physician Assistant

## 2020-05-13 ENCOUNTER — Ambulatory Visit: Payer: Federal, State, Local not specified - PPO | Admitting: Cardiology

## 2020-05-13 DIAGNOSIS — Z79899 Other long term (current) drug therapy: Secondary | ICD-10-CM | POA: Diagnosis not present

## 2020-05-13 DIAGNOSIS — J45909 Unspecified asthma, uncomplicated: Secondary | ICD-10-CM | POA: Diagnosis not present

## 2020-05-13 DIAGNOSIS — M1611 Unilateral primary osteoarthritis, right hip: Secondary | ICD-10-CM | POA: Diagnosis not present

## 2020-05-13 DIAGNOSIS — Z7982 Long term (current) use of aspirin: Secondary | ICD-10-CM | POA: Diagnosis not present

## 2020-05-13 DIAGNOSIS — I10 Essential (primary) hypertension: Secondary | ICD-10-CM | POA: Diagnosis not present

## 2020-05-13 LAB — CBC
HCT: 30.7 % — ABNORMAL LOW (ref 36.0–46.0)
Hemoglobin: 9.6 g/dL — ABNORMAL LOW (ref 12.0–15.0)
MCH: 21.7 pg — ABNORMAL LOW (ref 26.0–34.0)
MCHC: 31.3 g/dL (ref 30.0–36.0)
MCV: 69.3 fL — ABNORMAL LOW (ref 80.0–100.0)
Platelets: 327 10*3/uL (ref 150–400)
RBC: 4.43 MIL/uL (ref 3.87–5.11)
RDW: 19.6 % — ABNORMAL HIGH (ref 11.5–15.5)
WBC: 11.9 10*3/uL — ABNORMAL HIGH (ref 4.0–10.5)
nRBC: 0 % (ref 0.0–0.2)

## 2020-05-13 LAB — BASIC METABOLIC PANEL
Anion gap: 8 (ref 5–15)
BUN: 18 mg/dL (ref 6–20)
CO2: 26 mmol/L (ref 22–32)
Calcium: 8.6 mg/dL — ABNORMAL LOW (ref 8.9–10.3)
Chloride: 100 mmol/L (ref 98–111)
Creatinine, Ser: 1.18 mg/dL — ABNORMAL HIGH (ref 0.44–1.00)
GFR, Estimated: 53 mL/min — ABNORMAL LOW (ref >60)
Glucose, Bld: 147 mg/dL — ABNORMAL HIGH (ref 70–99)
Potassium: 4.2 mmol/L (ref 3.5–5.1)
Sodium: 134 mmol/L — ABNORMAL LOW (ref 135–145)

## 2020-05-13 MED ORDER — BACLOFEN 10 MG PO TABS: 10.0000 mg | ORAL_TABLET | Freq: Three times a day (TID) | ORAL | 0 refills | Status: DC

## 2020-05-13 MED ORDER — PROMETHAZINE HCL 12.5 MG PO TABS: 12.5000 mg | ORAL_TABLET | Freq: Every day | ORAL | 0 refills | Status: DC | PRN

## 2020-05-13 NOTE — Progress Notes (Signed)
Pt given D/C instructions with verbal understanding. Pt's Rx's were sent to the pharmacy by MD. Pt's incision is clean and dry with no sign of infection. Pt's IV was removed prior to D/C. Pt D/C'd home via wheelchair per MD order. Pt is stable @ D/C and has no other needs at this time. Rema Fendt, RN

## 2020-05-13 NOTE — Discharge Summary (Signed)
Patient ID: Toni Jones MRN: [id number] DOB/AGE: 1960-12-26 60 y.o.  Admit date: 05/12/2020 Discharge date: 05/13/2020  Admission Diagnoses:  Principal Problem:   Primary osteoarthritis of right hip Active Problems:   Status post total replacement of right hip   Discharge Diagnoses:  Same  Past Medical History:  Diagnosis Date   Arthritis    Asthma    Bronchitis    Chronic bronchitis (HCC)    Flesh-eating bacteria (HCC)    [place]   Foot fracture, left    Hypertension    Pneumonia    hx of    PONV (postoperative nausea and vomiting)     Surgeries: Procedure(s): RIGHT TOTAL HIP ARTHROPLASTY ANTERIOR APPROACH on 05/12/2020   Consultants:   Discharged Condition: Improved  Hospital Course: Niley Helbig is an 60 y.o. female who was admitted 05/12/2020 for operative treatment ofPrimary osteoarthritis of right hip. Patient has severe unremitting pain that affects sleep, daily activities, and work/hobbies. After pre-op clearance the patient was taken to the operating room on 05/12/2020 and underwent  Procedure(s): RIGHT TOTAL HIP ARTHROPLASTY ANTERIOR APPROACH.    Patient was given perioperative antibiotics:  Anti-infectives (From admission, onward)   Start     Dose/Rate Route Frequency Ordered Stop   05/18/20 1000  ciprofloxacin (CIPRO) tablet 500 mg        500 mg Oral 2 times daily 05/12/20 1250     05/12/20 1400  ceFAZolin (ANCEF) IVPB 2g/100 mL premix        2 g 200 mL/hr over 30 Minutes Intravenous Every 6 hours 05/12/20 0939 05/12/20 2015   05/12/20 0801  vancomycin (VANCOCIN) powder  Status:  Discontinued          As needed 05/12/20 0802 05/12/20 0938   05/12/20 0600  ceFAZolin (ANCEF) IVPB 2g/100 mL premix        2 g 200 mL/hr over 30 Minutes Intravenous On call to O.R. 05/12/20 0542 05/12/20 6144       Patient was given sequential compression devices, early ambulation, and chemoprophylaxis to prevent DVT.  Patient benefited maximally from  hospital stay and there were no complications.    Recent vital signs:  Patient Vitals for the past 24 hrs:  BP Temp Temp src Pulse Resp SpO2 Height Weight  05/13/20 0804 105/73 98.1 F (36.7 C) Oral 73 18 93 % -- --  05/13/20 0407 109/60 98.2 F (36.8 C) Oral 79 18 93 % -- --  05/12/20 2332 119/64 98 F (36.7 C) Oral 69 20 97 % -- --  05/12/20 2021 134/68 97.7 F (36.5 C) Oral 74 18 98 % -- --  05/12/20 1743 132/78 97.8 F (36.6 C) Oral 72 19 96 % -- --  05/12/20 1343 -- -- -- -- -- -- 5\' 5"  (1.651 m) 102.5 kg  05/12/20 1258 122/64 97.9 F (36.6 C) Oral 73 18 99 % -- --  05/12/20 1217 (!) 141/61 (!) 97.4 F (36.3 C) -- 84 (!) 50 100 % -- --  05/12/20 1203 (!) 117/49 -- -- 81 (!) 27 100 % -- --  05/12/20 1148 140/74 -- -- 75 (!) 31 100 % -- --  05/12/20 1133 (!) 143/73 -- -- 67 14 100 % -- --  05/12/20 1118 (!) 151/58 -- -- 73 16 100 % -- --  05/12/20 1102 (!) 160/73 -- -- 70 14 100 % -- --  05/12/20 1047 128/63 -- -- 60 13 100 % -- --  05/12/20 1032 (!) 148/67 -- -- 61 13  100 % -- --  05/12/20 1017 135/77 -- -- 67 15 100 % -- --  05/12/20 1002 (!) 126/53 -- -- 65 17 100 % -- --  05/12/20 0947 121/65 -- -- 69 15 100 % -- --  05/12/20 0932 (!) 115/58 -- -- 68 10 100 % -- --  05/12/20 0917 (!) 103/59 (!) 97.4 F (36.3 C) -- 77 15 99 % -- --     Recent laboratory studies: No results for input(s): WBC, HGB, HCT, PLT, NA, K, CL, CO2, BUN, CREATININE, GLUCOSE, INR, CALCIUM in the last 72 hours.  Invalid input(s): PT, 2   Discharge Medications:   Allergies as of 05/13/2020      Reactions   Onion Other (See Comments)   Raw onion oil Nose bleeds   Red Dye #40 [red Dye] Hives      Medication List    STOP taking these medications   methocarbamol 500 MG tablet Commonly known as: Robaxin     TAKE these medications   albuterol 108 (90 Base) MCG/ACT inhaler Commonly known as: VENTOLIN HFA Inhale 1-2 puffs into the lungs every 6 (six) hours as needed for wheezing or shortness  of breath.   aspirin EC 81 MG tablet Take 1 tablet (81 mg total) by mouth 2 (two) times daily. To be taken after surgery   baclofen 10 MG tablet Commonly known as: LIORESAL Take 1 tablet (10 mg total) by mouth 3 (three) times daily.   CALCIUM-MAGNESIUM-VITAMIN D PO Take 1 tablet by mouth daily. 1000 mg / 500 mg   CALCIUM-VITAMIN D PO Take 2 each by mouth daily. Gummies   ciprofloxacin 500 MG tablet Commonly known as: Cipro Take 1 tablet (500 mg total) by mouth 2 (two) times daily for 10 days.   ferrous gluconate 324 MG tablet Commonly known as: FERGON Take 1 tablet (324 mg total) by mouth daily with breakfast.   losartan 50 MG tablet Commonly known as: COZAAR Take 1 tablet (50 mg total) by mouth daily.   Multivitamin Adult Chew Chew 2 each by mouth daily. Woman 50 +   ondansetron 4 MG tablet Commonly known as: Zofran Take 1 tablet (4 mg total) by mouth every 8 (eight) hours as needed for nausea or vomiting.   oxyCODONE-acetaminophen 5-325 MG tablet Commonly known as: Percocet Take 1-2 tablets by mouth every 6 (six) hours as needed. To be taken after surgery   promethazine 12.5 MG tablet Commonly known as: PHENERGAN Take 1 tablet (12.5 mg total) by mouth daily as needed for nausea or vomiting. What changed:   when to take this  reasons to take this   sulfamethoxazole-trimethoprim 800-160 MG tablet Commonly known as: Bactrim DS Take 1 tablet by mouth 2 (two) times daily.            Durable Medical Equipment  (From admission, onward)         Start     Ordered   05/12/20 1251  DME Walker rolling  Once       Question:  Patient needs a walker to treat with the following condition  Answer:  History of hip replacement   05/12/20 1250   05/12/20 1251  DME 3 n 1  Once        05/12/20 1250   05/12/20 1251  DME Bedside commode  Once       Question:  Patient needs a bedside commode to treat with the following condition  Answer:  History of hip replacement  05/12/20 1250          Diagnostic Studies: DG Pelvis Portable  Result Date: 05/12/2020 CLINICAL DATA:  Status post right total hip arthroplasty. EXAM: PORTABLE PELVIS 1-2 VIEWS COMPARISON:  Radiographs 02/18/2020 FINDINGS: Well seated components of a total right hip arthroplasty. No complicating features are identified. The left hip prosthesis is stable. IMPRESSION: Well seated components of a total right hip arthroplasty. Electronically Signed   By: Rudie Meyer M.D.   On: 05/12/2020 10:10   DG C-Arm 1-60 Min  Result Date: 05/12/2020 CLINICAL DATA:  Right anterior total hip arthroplasty. EXAM: DG C-ARM 1-60 MIN; OPERATIVE RIGHT HIP WITH PELVIS FLUOROSCOPY TIME:  Fluoroscopy Time:  29 seconds. Radiation Exposure Index (if provided by the fluoroscopic device): 4.4 mGy. Number of Acquired Spot Images: 2 COMPARISON:  02/18/2020. FINDINGS: Two C-arm fluoroscopic images were obtained intraoperatively and submitted for post operative interpretation. These images demonstrate postsurgical changes of right total hip arthroplasty. Partially imaged prior left total hip arthroplasty. No unexpected findings. Please see the performing provider's procedural report for further detail. IMPRESSION: Right total hip arthroplasty. Electronically Signed   By: Feliberto Harts MD   On: 05/12/2020 10:21   DG HIP OPERATIVE UNILAT W OR W/O PELVIS RIGHT  Result Date: 05/12/2020 CLINICAL DATA:  Right anterior total hip arthroplasty. EXAM: DG C-ARM 1-60 MIN; OPERATIVE RIGHT HIP WITH PELVIS FLUOROSCOPY TIME:  Fluoroscopy Time:  29 seconds. Radiation Exposure Index (if provided by the fluoroscopic device): 4.4 mGy. Number of Acquired Spot Images: 2 COMPARISON:  02/18/2020. FINDINGS: Two C-arm fluoroscopic images were obtained intraoperatively and submitted for post operative interpretation. These images demonstrate postsurgical changes of right total hip arthroplasty. Partially imaged prior left total hip arthroplasty. No  unexpected findings. Please see the performing provider's procedural report for further detail. IMPRESSION: Right total hip arthroplasty. Electronically Signed   By: Feliberto Harts MD   On: 05/12/2020 10:21    Disposition: Discharge disposition: 01-Home or Self Care          Follow-up Information    Tarry Kos, MD In 2 weeks.   Specialty: Orthopedic Surgery Why: For suture removal, For wound re-check Contact information: [address] [place] [address] [phone]                Signed: Cristie Hem 05/13/2020, 8:10 AM

## 2020-05-13 NOTE — Progress Notes (Addendum)
Subjective: 1 Day Post-Op Procedure(s) (LRB): RIGHT TOTAL HIP ARTHROPLASTY ANTERIOR APPROACH (Right) Patient reports pain as mild.    Objective: Vital signs in last 24 hours: Temp:  [97.4 F (36.3 C)-98.2 F (36.8 C)] 98.2 F (36.8 C) (03/08 0407) Pulse Rate:  [60-84] 79 (03/08 0407) Resp:  [10-50] 18 (03/08 0407) BP: (103-160)/(49-78) 109/60 (03/08 0407) SpO2:  [93 %-100 %] 93 % (03/08 0407) Weight:  [102.5 kg] 102.5 kg (03/07 1343)  Intake/Output from previous day: 03/07 0701 - 03/08 0700 In: 898.1 [I.V.:500; IV Piggyback:398.1] Out: 400 [Urine:350; Blood:50] Intake/Output this shift: No intake/output data recorded.  No results for input(s): HGB in the last 72 hours. No results for input(s): WBC, RBC, HCT, PLT in the last 72 hours. No results for input(s): NA, K, CL, CO2, BUN, CREATININE, GLUCOSE, CALCIUM in the last 72 hours. No results for input(s): LABPT, INR in the last 72 hours.  Neurologically intact Neurovascular intact Sensation intact distally Intact pulses distally Dorsiflexion/Plantar flexion intact Incision: dressing C/D/I No cellulitis present Compartment soft   Assessment/Plan: 1 Day Post-Op Procedure(s) (LRB): RIGHT TOTAL HIP ARTHROPLASTY ANTERIOR APPROACH (Right) Advance diet Up with therapy D/C IV fluids Discharge home with home health this morning when she is ready WBAT RLE Labs from this am not back Post-op pain meds sent in        Cristie Hem 05/13/2020, 8:04 AM

## 2020-05-13 NOTE — Progress Notes (Signed)
Physical Therapy Treatment Patient Details Name: Toni Jones MRN: [id number] DOB: [birth year] Today's Date: 05/13/2020    History of Present Illness Pt is a 60 y/o female s/p R THA, direct anterior on 3/7. PMH includes HTN, asthma, and L THA.    PT Comments    Patient progressing well towards PT goals. Reports some soreness but otherwise feeling good and ready to d/c home. Tolerated bed mobility, transfers and gait training with Mod I-supervision for safety with cues for a more normalized gait pattern using RW for support. Reviewed HEP handout and there ex in supine, sitting and standing. Discussed safety with entering high bed using step stool. All education completed. Pt is functioning at [place] level and does not require further skilled therapy services while in the hospital. Has met all goals or they are adequate for d/c. Discharge from therapy.    Follow Up Recommendations  Follow surgeon's recommendation for DC plan and follow-up therapies     Equipment Recommendations  None recommended by PT    Recommendations for Other Services       Precautions / Restrictions Precautions Precautions: Fall Restrictions Weight Bearing Restrictions: Yes RLE Weight Bearing: Weight bearing as tolerated    Mobility  Bed Mobility Overal bed mobility: Needs Assistance Bed Mobility: Supine to Sit;Sit to Supine     Supine to sit: Modified independent (Device/Increase time) Sit to supine: Modified independent (Device/Increase time)   General bed mobility comments: HOB flat and no rails to simulate home; able to bring RLE to EOB with some effort and using UEs to bring into bed to return to supine.    Transfers Overall transfer level: Needs assistance Equipment used: Rolling walker (2 wheeled) Transfers: Sit to/from Stand Sit to Stand: Modified independent (Device/Increase time)         General transfer comment: Stood from EOB x1, from toilet x1, noa ssist needed, good demo  of hand placement.  Ambulation/Gait Ambulation/Gait assistance: Modified independent (Device/Increase time) Gait Distance (Feet): 150 Feet Assistive device: Rolling walker (2 wheeled) Gait Pattern/deviations: Step-through pattern;Decreased step length - right;Decreased step length - left;Decreased weight shift to right;Antalgic Gait velocity: Decreased   General Gait Details: Mildly antalgic gait. Supervision for safety. 1 standing rest break.   Stairs             Wheelchair Mobility    Modified Rankin (Stroke Patients Only)       Balance Overall balance assessment: Needs assistance Sitting-balance support: Feet supported;No upper extremity supported Sitting balance-Leahy Scale: Good     Standing balance support: During functional activity Standing balance-Leahy Scale: Fair Standing balance comment: Able to stand at sink and wash hands without difficulty or need for UE support, needs UE support for walking                            Cognition Arousal/Alertness: Awake/alert Behavior During Therapy: WFL for tasks assessed/performed Overall Cognitive Status: Within Functional Limits for tasks assessed                                        Exercises Total Joint Exercises Quad Sets: AROM;Both;10 reps;Supine Heel Slides: AROM;Right;5 reps;Supine Hip ABduction/ADduction: AROM;Right;5 reps;Supine    General Comments General comments (skin integrity, edema, etc.): bandage- clean, dry and intact.      Pertinent Vitals/Pain Pain Assessment: Faces Faces Pain Scale: Hurts a little  bit Pain Location: Rt hip Pain Descriptors / Indicators: Sore Pain Intervention(s): Monitored during session    Home Living                      Prior Function            PT Goals (current goals can now be found in the care plan section) Progress towards PT goals: Progressing toward goals    Frequency    7X/week      PT Plan Current plan  remains appropriate    Co-evaluation              AM-PAC PT "6 Clicks" Mobility   Outcome Measure  Help needed turning from your back to your side while in a flat bed without using bedrails?: None Help needed moving from lying on your back to sitting on the side of a flat bed without using bedrails?: None Help needed moving to and from a bed to a chair (including a wheelchair)?: A Little Help needed standing up from a chair using your arms (e.g., wheelchair or bedside chair)?: A Little Help needed to walk in hospital room?: A Little Help needed climbing 3-5 steps with a railing? : A Little 6 Click Score: 20    End of Session Equipment Utilized During Treatment: Gait belt Activity Tolerance: Patient tolerated treatment well Patient left: in bed;with call bell/phone within reach Nurse Communication: Mobility status PT Visit Diagnosis: Difficulty in walking, not elsewhere classified (R26.2);Other abnormalities of gait and mobility (R26.89);Pain Pain - Right/Left: Right Pain - part of body: Hip     Time: 6812-7517 PT Time Calculation (min) (ACUTE ONLY): 23 min  Charges:  $Gait Training: 8-22 mins $Therapeutic Exercise: 8-22 mins                     .Zettie Cooley, DPT Acute Rehabilitation Services Pager [phone] Office [phone]       Lacie Draft 05/13/2020, 9:44 AM

## 2020-05-14 ENCOUNTER — Telehealth: Payer: Self-pay

## 2020-05-14 NOTE — Telephone Encounter (Signed)
Per Dorene Sorrow. See msg below. FYI   " I wanted to make you aware that a patient Dr Roda Shutters referred to [place] for HHPT has declined, patient stated she did not feel like she needs our services and declined."

## 2020-05-14 NOTE — Telephone Encounter (Signed)
Ok thanks 

## 2020-05-27 ENCOUNTER — Encounter: Payer: Self-pay | Admitting: Orthopaedic Surgery

## 2020-05-27 ENCOUNTER — Ambulatory Visit (INDEPENDENT_AMBULATORY_CARE_PROVIDER_SITE_OTHER): Payer: Federal, State, Local not specified - PPO

## 2020-05-27 ENCOUNTER — Ambulatory Visit (INDEPENDENT_AMBULATORY_CARE_PROVIDER_SITE_OTHER): Payer: Federal, State, Local not specified - PPO | Admitting: Orthopaedic Surgery

## 2020-05-27 VITALS — Ht 65.0 in | Wt 226.0 lb

## 2020-05-27 DIAGNOSIS — Z96641 Presence of right artificial hip joint: Secondary | ICD-10-CM

## 2020-05-27 MED ORDER — PROMETHAZINE HCL 12.5 MG PO TABS: 12.5000 mg | ORAL_TABLET | Freq: Every day | ORAL | 0 refills | Status: DC | PRN

## 2020-05-27 MED ORDER — BACLOFEN 10 MG PO TABS: 10.0000 mg | ORAL_TABLET | Freq: Three times a day (TID) | ORAL | 0 refills | Status: DC

## 2020-05-27 NOTE — Progress Notes (Signed)
Post-Op Visit Note   Patient: Toni Jones           Date of Birth: December 30, 1960           MRN: [id number] Visit Date: 05/27/2020 PCP: Christen Butter, NP   Assessment & Plan:  Chief Complaint:  Chief Complaint  Patient presents with  . Right Hip - Follow-up    Right total hip arthroplasty 05/12/2020   Visit Diagnoses:  1. S/P total right hip arthroplasty     Plan:   Toni Jones is 2 weeks status post right total hip replacement.  She declined home health services.  She is doing well overall.  She feels some fullness and some inflammation.  Wants a refill of baclofen and Phenergan.  Overall doing well has no complaints.  Right hip shows healed surgical incision.  No signs of infection.  Good range of motion of the hip.  X-rays show a stable total hip replacement.  Toni Jones is 2 weeks status post right total hip replacement.  Doing well.  Dental prophylaxis reinforced.  To use aspirin for DVT prophylaxis.  Home exercises.  Follow-up in 4 weeks with standing AP pelvis x-rays.  Follow-Up Instructions: Return in about 4 weeks (around 06/24/2020).   Orders:  Orders Placed This Encounter  Procedures  . XR HIP UNILAT W OR W/O PELVIS 2-3 VIEWS RIGHT   Meds ordered this encounter  Medications  . baclofen (LIORESAL) 10 MG tablet    Sig: Take 1 tablet (10 mg total) by mouth 3 (three) times daily.    Dispense:  30 each    Refill:  0  . promethazine (PHENERGAN) 12.5 MG tablet    Sig: Take 1 tablet (12.5 mg total) by mouth daily as needed for nausea or vomiting.    Dispense:  60 tablet    Refill:  0    Imaging: XR HIP UNILAT W OR W/O PELVIS 2-3 VIEWS RIGHT  Result Date: 05/27/2020 Stable right total hip replacement without complication.   PMFS History: Patient Active Problem List   Diagnosis Date Noted  . Status post total replacement of right hip 05/12/2020  . Primary osteoarthritis of right hip 04/25/2020  . Status post total replacement of left hip 02/18/2020  . Arthritis   .  Asthma   . Bronchitis   . Chronic bronchitis (HCC)   . Flesh-eating bacteria (HCC)   . Foot fracture, left   . Bilateral thigh pain 01/29/2020  . Essential hypertension 01/29/2020   Past Medical History:  Diagnosis Date  . Arthritis   . Asthma   . Bronchitis   . Chronic bronchitis (HCC)   . Flesh-eating bacteria (HCC[place]    [place]  . Foot fracture, left   . Hypertension   . Pneumonia    hx of   . PONV (postoperative nausea and vomiting)     Family History  Adopted: Yes  Problem Relation Age of Onset  . Heart attack Mother   . Cancer Son        osteogenic carcinoma    Past Surgical History:  Procedure Laterality Date  . APPENDECTOMY    . left foot surgery     . MANDIBLE SURGERY    . TONSILLECTOMY    . TOTAL HIP ARTHROPLASTY Left 02/18/2020   Procedure: LEFT TOTAL HIP ARTHROPLASTY ANTERIOR APPROACH;  Surgeon: Tarry Kos, MD;  Location: [facility];  Service: Orthopedics;  Laterality: Left;  . TOTAL HIP ARTHROPLASTY Right 05/12/2020   Procedure: RIGHT TOTAL HIP ARTHROPLASTY  ANTERIOR APPROACH;  Surgeon: Tarry Kos, MD;  Location: [facility] OR;  Service: Orthopedics;  Laterality: Right;  . WISDOM TOOTH EXTRACTION     Social History   Occupational History  . Not on file  Tobacco Use  . Smoking status: Never Smoker  . Smokeless tobacco: Never Used  Vaping Use  . Vaping Use: Never used  Substance and Sexual Activity  . Alcohol use: Never  . Drug use: Never  . Sexual activity: Not Currently    Partners: Male

## 2020-06-24 ENCOUNTER — Ambulatory Visit (INDEPENDENT_AMBULATORY_CARE_PROVIDER_SITE_OTHER): Payer: Federal, State, Local not specified - PPO | Admitting: Orthopaedic Surgery

## 2020-06-24 ENCOUNTER — Ambulatory Visit (INDEPENDENT_AMBULATORY_CARE_PROVIDER_SITE_OTHER): Payer: Federal, State, Local not specified - PPO

## 2020-06-24 ENCOUNTER — Encounter: Payer: Self-pay | Admitting: Orthopaedic Surgery

## 2020-06-24 VITALS — Ht 65.0 in | Wt 226.0 lb

## 2020-06-24 DIAGNOSIS — Z96641 Presence of right artificial hip joint: Secondary | ICD-10-CM

## 2020-06-24 MED ORDER — HYDROCODONE-ACETAMINOPHEN 5-325 MG PO TABS: 1.0000 | ORAL_TABLET | Freq: Once | ORAL | 0 refills | Status: DC | PRN

## 2020-06-24 MED ORDER — AMOXICILLIN 500 MG PO CAPS: 2000.0000 mg | ORAL_CAPSULE | Freq: Once | ORAL | 6 refills | Status: AC

## 2020-06-24 MED ORDER — DIAZEPAM 5 MG PO TABS: 5.0000 mg | ORAL_TABLET | Freq: Once | ORAL | 0 refills | Status: DC | PRN

## 2020-06-24 MED ORDER — PROMETHAZINE HCL 25 MG PO TABS: 25.0000 mg | ORAL_TABLET | Freq: Four times a day (QID) | ORAL | 1 refills | Status: DC | PRN

## 2020-06-24 NOTE — Progress Notes (Signed)
Post-Op Visit Note   Patient: Toni Jones           Date of Birth: 11/03/1960           MRN: [id number] Visit Date: 06/24/2020 PCP: Christen Butter, NP   Assessment & Plan:  Chief Complaint:  Chief Complaint  Patient presents with  . Right Hip - Follow-up    Right total hip arthroplasty 05/12/2020   Visit Diagnoses:  1. S/P total right hip arthroplasty     Plan:   [place] is a 6-week status post right total hip replacement.  Overall doing well.  She is very pleased.  She would like to return back to light duty.  Surgical scar is healed.  No signs of infection.  She has excellent range of motion and function of her hip.  She has no pain.  Strength is moderately decreased to hip flexion and hip abduction.  Work note was filled out today.  She will still need to be placed on restrictions given that she has decreased strength and endurance.  Medications refilled for upcoming dental visit.  Activity restrictions and precautions reviewed in general.  Follow-up in 6 weeks for recheck.  She will continue to work on strengthening during her home exercises.  We will see her back in 6 weeks for recheck.  Follow-Up Instructions: Return in about 6 weeks (around 08/05/2020).   Orders:  Orders Placed This Encounter  Procedures  . XR Pelvis 1-2 Views   Meds ordered this encounter  Medications  . amoxicillin (AMOXIL) 500 MG capsule    Sig: Take 4 capsules (2,000 mg total) by mouth once for 1 dose.    Dispense:  4 capsule    Refill:  6  . diazepam (VALIUM) 5 MG tablet    Sig: Take 1 tablet (5 mg total) by mouth once as needed for up to 1 dose for muscle spasms.    Dispense:  1 tablet    Refill:  0  . HYDROcodone-acetaminophen (NORCO) 5-325 MG tablet    Sig: Take 1 tablet by mouth once as needed for up to 1 dose.    Dispense:  1 tablet    Refill:  0  . promethazine (PHENERGAN) 25 MG tablet    Sig: Take 1 tablet (25 mg total) by mouth every 6 (six) hours as needed for nausea.     Dispense:  30 tablet    Refill:  1    Imaging: XR Pelvis 1-2 Views  Result Date: 06/24/2020 Stable total hip replacement without complications   PMFS History: Patient Active Problem List   Diagnosis Date Noted  . Status post total replacement of right hip 05/12/2020  . Primary osteoarthritis of right hip 04/25/2020  . Status post total replacement of left hip 02/18/2020  . Arthritis   . Asthma   . Bronchitis   . Chronic bronchitis (HCC)   . Flesh-eating bacteria (HCC)   . Foot fracture, left   . Bilateral thigh pain 01/29/2020  . Essential hypertension 01/29/2020   Past Medical History:  Diagnosis Date  . Arthritis   . Asthma   . Bronchitis   . Chronic bronchitis (HCC)   . Flesh-eating bacteria (HCC)    [place]  . Foot fracture, left   . Hypertension   . Pneumonia    hx of   . PONV (postoperative nausea and vomiting)     Family History  Adopted: Yes  Problem Relation Age of Onset  . Heart attack Mother   .  Cancer Son        osteogenic carcinoma    Past Surgical History:  Procedure Laterality Date  . APPENDECTOMY    . left foot surgery     . MANDIBLE SURGERY    . TONSILLECTOMY    . TOTAL HIP ARTHROPLASTY Left 02/18/2020   Procedure: LEFT TOTAL HIP ARTHROPLASTY ANTERIOR APPROACH;  Surgeon: Tarry Kos, MD;  Location: [facility];  Service: Orthopedics;  Laterality: Left;  . TOTAL HIP ARTHROPLASTY Right 05/12/2020   Procedure: RIGHT TOTAL HIP ARTHROPLASTY ANTERIOR APPROACH;  Surgeon: Tarry Kos, MD;  Location: [facility] OR;  Service: Orthopedics;  Laterality: Right;  . WISDOM TOOTH EXTRACTION     Social History   Occupational History  . Not on file  Tobacco Use  . Smoking status: Never Smoker  . Smokeless tobacco: Never Used  Vaping Use  . Vaping Use: Never used  Substance and Sexual Activity  . Alcohol use: Never  . Drug use: Never  . Sexual activity: Not Currently    Partners: Male

## 2020-08-05 ENCOUNTER — Ambulatory Visit (INDEPENDENT_AMBULATORY_CARE_PROVIDER_SITE_OTHER): Payer: Federal, State, Local not specified - PPO | Admitting: Medical-Surgical

## 2020-08-05 ENCOUNTER — Encounter: Payer: Self-pay | Admitting: Orthopaedic Surgery

## 2020-08-05 ENCOUNTER — Ambulatory Visit (INDEPENDENT_AMBULATORY_CARE_PROVIDER_SITE_OTHER): Payer: Federal, State, Local not specified - PPO | Admitting: Orthopaedic Surgery

## 2020-08-05 ENCOUNTER — Encounter: Payer: Self-pay | Admitting: Medical-Surgical

## 2020-08-05 ENCOUNTER — Other Ambulatory Visit: Payer: Self-pay

## 2020-08-05 VITALS — BP 151/74 | HR 82 | Temp 98.8°F | Resp 20 | Ht 65.0 in | Wt 224.0 lb

## 2020-08-05 DIAGNOSIS — Z6837 Body mass index (BMI) 37.0-37.9, adult: Secondary | ICD-10-CM

## 2020-08-05 DIAGNOSIS — Z96642 Presence of left artificial hip joint: Secondary | ICD-10-CM

## 2020-08-05 DIAGNOSIS — I10 Essential (primary) hypertension: Secondary | ICD-10-CM

## 2020-08-05 DIAGNOSIS — Z96643 Presence of artificial hip joint, bilateral: Secondary | ICD-10-CM

## 2020-08-05 DIAGNOSIS — J410 Simple chronic bronchitis: Secondary | ICD-10-CM

## 2020-08-05 DIAGNOSIS — Z96641 Presence of right artificial hip joint: Secondary | ICD-10-CM

## 2020-08-05 MED ORDER — BACLOFEN 10 MG PO TABS: 10.0000 mg | ORAL_TABLET | Freq: Every evening | ORAL | 1 refills | Status: DC | PRN

## 2020-08-05 MED ORDER — PROMETHAZINE HCL 12.5 MG PO TABS
12.5000 mg | ORAL_TABLET | Freq: Every day | ORAL | 0 refills | Status: DC | PRN
Start: 2020-08-05 — End: 2021-01-19

## 2020-08-05 MED ORDER — LOSARTAN POTASSIUM 50 MG PO TABS: 50.0000 mg | ORAL_TABLET | Freq: Every day | ORAL | 1 refills | Status: DC

## 2020-08-05 NOTE — Progress Notes (Signed)
Subjective:    CC: HTN follow up  HPI: Pleasant 60 year old female presenting today for follow-up on hypertension.  She has been doing very well since her second hip replacement surgery.  Notes that she is moving quite a bit more and has started exercising some.  She was housesitting recently and noted that her knees were a bit painful after going up and down stairs all day.  She does have some difficulty with her legs bothering her while she sleeps.  Notes that baclofen helps tremendously with this.  Has gone back to work for hours and has a follow-up with her surgeon today where she plans to request being cleared for 8-hour shifts.  Over the past 2 to 3 weeks, she has had GI issues.  Notes that she was originally seen by her dentist to prescribe her antibiotics for an ear infection.  After starting this, she developed nausea and vomiting.  She uses Phenergan 12.5 mg as needed to manage this.  Over the past 2 to 3 weeks because of her nausea, she has not been taking her regular maintenance medications including losartan.  She has been a couple of days without ear pain and nausea/vomiting now so she plans to restart her losartan.  Denies chest pain, shortness of breath, fever, chills, palpitations, lower extremity edema.  Has been home and out of work for the last 6 months due to her need for bilateral hip replacement.  During this time, she was not exposed to environmental and infectious issues.  Notes that her chronic bronchitis did not flare this past year since she was able to avoid exposure.  I reviewed the past medical history, family history, social history, surgical history, and allergies today and no changes were needed.  Please see the problem list section below in epic for further details.  Past Medical History: Past Medical History:  Diagnosis Date  . Arthritis   . Asthma   . Bronchitis   . Chronic bronchitis (HCC)   . Flesh-eating bacteria (HCC[place]    [place]  . Foot  fracture, left   . Hypertension   . Pneumonia    hx of   . PONV (postoperative nausea and vomiting)    Past Surgical History: Past Surgical History:  Procedure Laterality Date  . APPENDECTOMY    . left foot surgery     . MANDIBLE SURGERY    . TONSILLECTOMY    . TOTAL HIP ARTHROPLASTY Left 02/18/2020   Procedure: LEFT TOTAL HIP ARTHROPLASTY ANTERIOR APPROACH;  Surgeon: Tarry Kos, MD;  Location: [facility];  Service: Orthopedics;  Laterality: Left;  . TOTAL HIP ARTHROPLASTY Right 05/12/2020   Procedure: RIGHT TOTAL HIP ARTHROPLASTY ANTERIOR APPROACH;  Surgeon: Tarry Kos, MD;  Location: [facility];  Service: Orthopedics;  Laterality: Right;  . WISDOM TOOTH EXTRACTION     Social History: Social History   Socioeconomic History  . Marital status: Divorced    Spouse name: Not on file  . Number of children: Not on file  . Years of education: Not on file  . Highest education level: Not on file  Occupational History  . Not on file  Tobacco Use  . Smoking status: Never Smoker  . Smokeless tobacco: Never Used  Vaping Use  . Vaping Use: Never used  Substance and Sexual Activity  . Alcohol use: Never  . Drug use: Never  . Sexual activity: Not Currently    Partners: Male  Other Topics Concern  . Not on file  Social History Narrative  . Not on file   Social Determinants of Health   Financial Resource Strain: Not on file  Food Insecurity: Not on file  Transportation Needs: Not on file  Physical Activity: Not on file  Stress: Not on file  Social Connections: Not on file   Family History: Family History  Adopted: Yes  Problem Relation Age of Onset  . Heart attack Mother   . Cancer Son        osteogenic carcinoma   Allergies: Allergies  Allergen Reactions  . Onion Other (See Comments)    Raw onion oil Nose bleeds  . Red Dye #40 [Red Dye] Hives   Medications: See med rec.  Review of Systems: See HPI for pertinent positives and negatives.   Objective:    General:  Well Developed, well nourished, and in no acute distress.  Neuro: Alert and oriented x3.  HEENT: Normocephalic, atraumatic.  Skin: Warm and dry. Cardiac: Regular rate and rhythm, no murmurs rubs or gallops, no lower extremity edema.  Respiratory: Clear to auscultation bilaterally. Not using accessory muscles, speaking in full sentences.  Impression and Recommendations:    1. Essential hypertension Resume losartan 50 mg daily.  Follow low-sodium diet.  Recommend checking blood pressure a couple times a week with home readings goal of 130/80 or less.   2. Body mass index (BMI) 37.0-37.9, adult Education provided on weight loss recommendations for healthy BMI.  Since she is now able to move much better, she has plans to start exercising more and watching her diet.   3. Simple chronic bronchitis (HCC) Stable without intervention.   4. Status post total replacement of left hip 5. Status post total replacement of right hip Continue Baclofen 10mg  at bedtime as needed.   Return in about 2 weeks (around 08/19/2020) for nurse visit for BP check. ___________________________________________ 08/21/2020, DNP, APRN, FNP-BC Primary Care and Sports Medicine [facility]

## 2020-08-05 NOTE — Progress Notes (Signed)
Post-Op Visit Note   Patient: Toni Jones           Date of Birth: [birth year]           MRN: [id number] Visit Date: 08/05/2020 PCP: Christen Butter, NP   Assessment & Plan:  Chief Complaint:  Chief Complaint  Patient presents with  . Right Hip - Pain, Routine Post Op   Visit Diagnoses:  1. Status post bilateral total hip replacement     Plan: Patient is a pleasant 59 year old female who comes in today 6 months out left total hip replacement 3 months out right total hip replacement.  She is doing well in regards to both hips.  She has been doing water aerobics where she has regained significant range of motion and strength.  She has minimal to no pain.  She occasionally takes baclofen at night.  Examination of both hips reveals full range of motion and strength.  Painless logroll.  She is neurovascular intact distally.  Dental prophylaxis reinforced.  We will allow her to return to work with minor restrictions at this point.  Follow-up with [place] in 1 month's time for repeat evaluation per her work protocol.  Call with concerns or questions meantime.  Follow-Up Instructions: Return in about 1 month (around 09/04/2020).   Orders:  No orders of the defined types were placed in this encounter.  No orders of the defined types were placed in this encounter.   Imaging: No new imaging  PMFS History: Patient Active Problem List   Diagnosis Date Noted  . Status post total replacement of right hip 05/12/2020  . Primary osteoarthritis of right hip 04/25/2020  . Status post total replacement of left hip 02/18/2020  . Arthritis   . Asthma   . Bronchitis   . Chronic bronchitis (HCC)   . Flesh-eating bacteria (HCC)   . Foot fracture, left   . Bilateral thigh pain 01/29/2020  . Essential hypertension 01/29/2020   Past Medical History:  Diagnosis Date  . Arthritis   . Asthma   . Bronchitis   . Chronic bronchitis (HCC)   . Flesh-eating bacteria (HCC)    [place]  . Foot  fracture, left   . Hypertension   . Pneumonia    hx of   . PONV (postoperative nausea and vomiting)     Family History  Adopted: Yes  Problem Relation Age of Onset  . Heart attack Mother   . Cancer Son        osteogenic carcinoma    Past Surgical History:  Procedure Laterality Date  . APPENDECTOMY    . left foot surgery     . MANDIBLE SURGERY    . TONSILLECTOMY    . TOTAL HIP ARTHROPLASTY Left 02/18/2020   Procedure: LEFT TOTAL HIP ARTHROPLASTY ANTERIOR APPROACH;  Surgeon: Tarry Kos, MD;  Location: [facility];  Service: Orthopedics;  Laterality: Left;  . TOTAL HIP ARTHROPLASTY Right 05/12/2020   Procedure: RIGHT TOTAL HIP ARTHROPLASTY ANTERIOR APPROACH;  Surgeon: Tarry Kos, MD;  Location: [facility];  Service: Orthopedics;  Laterality: Right;  . WISDOM TOOTH EXTRACTION     Social History   Occupational History  . Not on file  Tobacco Use  . Smoking status: Never Smoker  . Smokeless tobacco: Never Used  Vaping Use  . Vaping Use: Never used  Substance and Sexual Activity  . Alcohol use: Never  . Drug use: Never  . Sexual activity: Not Currently    Partners: Male

## 2020-08-11 ENCOUNTER — Telehealth: Payer: Self-pay | Admitting: Orthopaedic Surgery

## 2020-08-11 NOTE — Telephone Encounter (Signed)
Patient called advised the paperwork that was filled out last week was wrong and she need an appointment so that Dr. Roda Shutters can go over the paperwork with her so that she can take it to her employer so she can return to work. I advised paperwork is completed by [employer] and she said Dr. Roda Shutters need to go over paperwork with her and not [employer].  The number to contact patient is [phone]

## 2020-08-12 ENCOUNTER — Telehealth: Payer: Self-pay

## 2020-08-12 NOTE — Telephone Encounter (Signed)
Please make her an appt per Dr Roda Shutters. See message below.

## 2020-08-12 NOTE — Telephone Encounter (Signed)
Stop the losartan.  Plan to continue with the nurse visit on 615 so we can evaluate blood pressure at that time.  If still elevated, will likely switch agents to a different medication.

## 2020-08-12 NOTE — Telephone Encounter (Signed)
Pt called and LVM regarding losartan 50 mg. She said that she started the Rx a month ago and started having N/V and vertigo. She stopped the medication but thought it was r/t an ear infection. When she came in for an OV 08/05/20 she was told to restart the medication. After restarting the Rx she started having N/V, vertigo, and insomnia. Pt states she is stopping the losartan. She is scheduled to come in for a NV on 08/20/20 and wants to know if she still needs to come in and would like to know if Joy wants to change the medications. Marland Kitchenadvise

## 2020-08-13 NOTE — Telephone Encounter (Signed)
Patient aware of recommendation. No further questions or concerns at this time.

## 2020-08-19 ENCOUNTER — Ambulatory Visit (INDEPENDENT_AMBULATORY_CARE_PROVIDER_SITE_OTHER): Payer: Federal, State, Local not specified - PPO | Admitting: Orthopaedic Surgery

## 2020-08-19 ENCOUNTER — Encounter: Payer: Self-pay | Admitting: Orthopaedic Surgery

## 2020-08-19 DIAGNOSIS — Z96643 Presence of artificial hip joint, bilateral: Secondary | ICD-10-CM

## 2020-08-19 NOTE — Progress Notes (Signed)
   Post-Op Visit Note   Patient: Toni Jones           Date of Birth: October 06, 1960           MRN: [id number] Visit Date: 08/19/2020 PCP: Christen Butter, NP   Assessment & Plan:  Chief Complaint:  Chief Complaint  Patient presents with   Right Hip - Routine Post Op   Left Hip - Routine Post Op   Visit Diagnoses:  1. Status post bilateral total hip replacement     Plan: Toni Jones is 3 months status post right total hip replacement and 71-month status post left total hip replacement.  Overall doing well and ready to go back to work.  She has no complaints.  Surgical scars are fully healed.  She has normal ambulation and gait.  No pain with hip range of motion.  From my standpoint Toni Jones is ready to go back to work without restrictions after reviewing her job description.  We will need to see her back in another 3 months with standing AP pelvis x-rays.  Questions encouraged and answered.  Dental prophylaxis reinforced.  Follow-Up Instructions: Return in about 3 months (around 11/19/2020).   Orders:  No orders of the defined types were placed in this encounter.  No orders of the defined types were placed in this encounter.   Imaging: No results found.  PMFS History: Patient Active Problem List   Diagnosis Date Noted   Status post total replacement of right hip 05/12/2020   Primary osteoarthritis of right hip 04/25/2020   Status post total replacement of left hip 02/18/2020   Arthritis    Asthma    Bronchitis    Chronic bronchitis (HCC)    Flesh-eating bacteria (HCC)    Foot fracture, left    Bilateral thigh pain 01/29/2020   Essential hypertension 01/29/2020   Past Medical History:  Diagnosis Date   Arthritis    Asthma    Bronchitis    Chronic bronchitis (HCC)    Flesh-[place]ating bacteria (HCC[place]   Foot fracture, left    Hypertension    Pneumonia    hx of    PONV (postoperative nausea and vomiting)     Family History  Adopted: Yes  Problem Relation  Age of Onset   Heart attack Mother    Cancer Son        osteogenic carcinoma    Past Surgical History:  Procedure Laterality Date   APPENDECTOMY     left foot surgery      MANDIBLE SURGERY     TONSILLECTOMY     TOTAL HIP ARTHROPLASTY Left 02/18/2020   Procedure: LEFT TOTAL HIP ARTHROPLASTY ANTERIOR APPROACH;  Surgeon: Tarry Kos, MD;  Location: [facility];  Service: Orthopedics;  Laterality: Left;   TOTAL HIP ARTHROPLASTY Right 05/12/2020   Procedure: RIGHT TOTAL HIP ARTHROPLASTY ANTERIOR APPROACH;  Surgeon: Tarry Kos, MD;  Location: [facility];  Service: Orthopedics;  Laterality: Right;   WISDOM TOOTH EXTRACTION     Social History   Occupational History   Not on file  Tobacco Use   Smoking status: Never   Smokeless tobacco: Never  Vaping Use   Vaping Use: Never used  Substance and Sexual Activity   Alcohol use: Never   Drug use: Never   Sexual activity: Not Currently    Partners: Male

## 2020-08-20 ENCOUNTER — Ambulatory Visit: Payer: Federal, State, Local not specified - PPO

## 2020-08-21 ENCOUNTER — Other Ambulatory Visit: Payer: Self-pay

## 2020-08-21 ENCOUNTER — Ambulatory Visit (INDEPENDENT_AMBULATORY_CARE_PROVIDER_SITE_OTHER): Payer: Federal, State, Local not specified - PPO | Admitting: Medical-Surgical

## 2020-08-21 VITALS — BP 137/61 | HR 84

## 2020-08-21 DIAGNOSIS — I10 Essential (primary) hypertension: Secondary | ICD-10-CM

## 2020-08-21 NOTE — Patient Instructions (Signed)
Low-Sodium Eating Plan Sodium, which is an element that makes up salt, helps you maintain a healthy balance of fluids in your body. Too much sodium can increase your blood pressure and cause fluid and waste to be held in your body. Your health care provider or dietitian may recommend following this plan if you have high blood pressure (hypertension), kidney disease, liver disease, or heart failure. Eating less sodium can help lower your blood pressure, reduce swelling, and protect your heart, liver, and kidneys. What are tips for following this plan? Reading food labels The Nutrition Facts label lists the amount of sodium in one serving of the food. If you eat more than one serving, you must multiply the listed amount of sodium by the number of servings. Choose foods with less than 140 mg of sodium per serving. Avoid foods with 300 mg of sodium or more per serving. Shopping  Look for lower-sodium products, often labeled as "low-sodium" or "no salt added." Always check the sodium content, even if foods are labeled as "unsalted" or "no salt added." Buy fresh foods. Avoid canned foods and pre-made or frozen meals. Avoid canned, cured, or processed meats. Buy breads that have less than 80 mg of sodium per slice. Cooking  Eat more home-cooked food and less restaurant, buffet, and fast food. Avoid adding salt when cooking. Use salt-free seasonings or herbs instead of table salt or sea salt. Check with your health care provider or pharmacist before using salt substitutes. Cook with plant-based oils, such as canola, sunflower, or olive oil. Meal planning When eating at a restaurant, ask that your food be prepared with less salt or no salt, if possible. Avoid dishes labeled as brined, pickled, cured, smoked, or made with soy sauce, miso, or teriyaki sauce. Avoid foods that contain MSG (monosodium glutamate). MSG is sometimes added to Chinese food, bouillon, and some canned foods. Make meals that can  be grilled, baked, poached, roasted, or steamed. These are generally made with less sodium. General information Most people on this plan should limit their sodium intake to 1,500-2,000 mg (milligrams) of sodium each day. What foods should I eat? Fruits Fresh, frozen, or canned fruit. Fruit juice. Vegetables Fresh or frozen vegetables. "No salt added" canned vegetables. "No salt added" tomato sauce and paste. Low-sodium or reduced-sodium tomato and vegetable juice. Grains Low-sodium cereals, including oats, puffed wheat and rice, and shredded wheat. Low-sodium crackers. Unsalted rice. Unsalted pasta. Low-sodium bread. Whole-grain breads and whole-grain pasta. Meats and other proteins Fresh or frozen (no salt added) meat, poultry, seafood, and fish. Low-sodium canned tuna and salmon. Unsalted nuts. Dried peas, beans, and lentils without added salt. Unsalted canned beans. Eggs. Unsalted nut butters. Dairy Milk. Soy milk. Cheese that is naturally low in sodium, such as ricotta cheese, fresh mozzarella, or Swiss cheese. Low-sodium or reduced-sodium cheese. Cream cheese. Yogurt. Seasonings and condiments Fresh and dried herbs and spices. Salt-free seasonings. Low-sodium mustard and ketchup. Sodium-free salad dressing. Sodium-free light mayonnaise. Fresh or refrigerated horseradish. Lemon juice. Vinegar. Other foods Homemade, reduced-sodium, or low-sodium soups. Unsalted popcorn and pretzels. Low-salt or salt-free chips. The items listed above may not be a complete list of foods and beverages you can eat. Contact a dietitian for more information. What foods should I avoid? Vegetables Sauerkraut, pickled vegetables, and relishes. Olives. French fries. Onion rings. Regular canned vegetables (not low-sodium or reduced-sodium). Regular canned tomato sauce and paste (not low-sodium or reduced-sodium). Regular tomato and vegetable juice (not low-sodium or reduced-sodium). Frozen vegetables in  sauces. Grains   Instant hot cereals. Bread stuffing, pancake, and biscuit mixes. Croutons. Seasoned rice or pasta mixes. Noodle soup cups. Boxed or frozen macaroni and cheese. Regular salted crackers. Self-rising flour. Meats and other proteins Meat or fish that is salted, canned, smoked, spiced, or pickled. Precooked or cured meat, such as sausages or meat loaves. Bacon. Ham. Pepperoni. Hot dogs. Corned beef. Chipped beef. Salt pork. Jerky. Pickled herring. Anchovies and sardines. Regular canned tuna. Salted nuts. Dairy Processed cheese and cheese spreads. Hard cheeses. Cheese curds. Blue cheese. Feta cheese. String cheese. Regular cottage cheese. Buttermilk. Canned milk. Fats and oils Salted butter. Regular margarine. Ghee. Bacon fat. Seasonings and condiments Onion salt, garlic salt, seasoned salt, table salt, and sea salt. Canned and packaged gravies. Worcestershire sauce. Tartar sauce. Barbecue sauce. Teriyaki sauce. Soy sauce, including reduced-sodium. Steak sauce. Fish sauce. Oyster sauce. Cocktail sauce. Horseradish that you find on the shelf. Regular ketchup and mustard. Meat flavorings and tenderizers. Bouillon cubes. Hot sauce. Pre-made or packaged marinades. Pre-made or packaged taco seasonings. Relishes. Regular salad dressings. Salsa. Other foods Salted popcorn and pretzels. Corn chips and puffs. Potato and tortilla chips. Canned or dried soups. Pizza. Frozen entrees and pot pies. The items listed above may not be a complete list of foods and beverages you should avoid. Contact a dietitian for more information. Summary Eating less sodium can help lower your blood pressure, reduce swelling, and protect your heart, liver, and kidneys. Most people on this plan should limit their sodium intake to 1,500-2,000 mg (milligrams) of sodium each day. Canned, boxed, and frozen foods are high in sodium. Restaurant foods, fast foods, and pizza are also very high in sodium. You also get sodium by  adding salt to food. Try to cook at home, eat more fresh fruits and vegetables, and eat less fast food and canned, processed, or prepared foods. This information is not intended to replace advice given to you by your health care provider. Make sure you discuss any questions you have with your health care provider. Document Revised: 03/30/2019 Document Reviewed: 01/24/2019 Elsevier Patient Education  2022 Elsevier Inc.  

## 2020-08-21 NOTE — Progress Notes (Signed)
Pt in for BP check since her Losartan was stopped due to side effects. First reading was 161/64.  After sitting for 10 min her reading was 137/61.

## 2020-08-21 NOTE — Progress Notes (Signed)
Discontinue Losartan. Continue exercise, low sodium diet, and weight loss efforts. Return in 6 months for follow up. If BP at home begins to run higher than 130/80 consistently, return sooner for evaluation.  Thayer Ohm, DNP, APRN, FNP-BC [facility]

## 2020-09-04 ENCOUNTER — Encounter: Payer: Federal, State, Local not specified - PPO | Admitting: Orthopaedic Surgery

## 2020-10-01 DIAGNOSIS — K011 Impacted teeth: Secondary | ICD-10-CM | POA: Diagnosis not present

## 2020-11-04 ENCOUNTER — Ambulatory Visit (INDEPENDENT_AMBULATORY_CARE_PROVIDER_SITE_OTHER): Payer: Federal, State, Local not specified - PPO | Admitting: Orthopaedic Surgery

## 2020-11-04 ENCOUNTER — Ambulatory Visit: Payer: Self-pay

## 2020-11-04 ENCOUNTER — Other Ambulatory Visit: Payer: Self-pay

## 2020-11-04 ENCOUNTER — Encounter: Payer: Self-pay | Admitting: Orthopaedic Surgery

## 2020-11-04 DIAGNOSIS — Z96643 Presence of artificial hip joint, bilateral: Secondary | ICD-10-CM | POA: Diagnosis not present

## 2020-11-04 DIAGNOSIS — M25562 Pain in left knee: Secondary | ICD-10-CM

## 2020-11-04 DIAGNOSIS — G8929 Other chronic pain: Secondary | ICD-10-CM

## 2020-11-04 DIAGNOSIS — M545 Low back pain, unspecified: Secondary | ICD-10-CM

## 2020-11-04 MED ORDER — PREDNISONE 10 MG (21) PO TBPK: ORAL_TABLET | ORAL | 0 refills | Status: DC

## 2020-11-04 NOTE — Progress Notes (Signed)
Office Visit Note   Patient: Toni Jones           Date of Birth: 07-20-1960           MRN: [id number] Visit Date: 11/04/2020              Requested by: Christen Butter, NP [address] [place],  [place] [address] PCP: Christen Butter, NP   Assessment & Plan: Visit Diagnoses:  1. Status post bilateral total hip replacement   2. Low back pain, unspecified back pain laterality, unspecified chronicity, unspecified whether sciatica present   3. Chronic pain of left knee      Plan: Impression is left lower extremity pain and weakness likely referred due to lumbar spine.  Based on the findings on x-ray today, would like to make an urgent referral to Dr. Conchita Paris for further evaluation and treatment recommendation.  In the meantime, I have started her on a steroid taper.  She will follow-up with [place] as needed for her back.  Call with concerns or questions in the meantime.  Follow-Up Instructions: No follow-ups on file.   Orders:  Orders Placed This Encounter  Procedures   XR Lumbar Spine 2-3 Views   XR KNEE 3 VIEW LEFT   XR Pelvis 1-2 Views   Meds ordered this encounter  Medications   predniSONE (STERAPRED UNI-PAK 21 TAB) 10 MG (21) TBPK tablet    Sig: Take as directed    Dispense:  21 tablet    Refill:  0      Procedures: No procedures performed   Clinical Data: No additional findings.   Subjective: Chief Complaint  Patient presents with   Lower Back - Pain   Right Hip - Pain   Left Hip - Pain    HPI patient is a pleasant 60 year old female who comes in today with concerns about her left lower extremity.  She is status post left total hip replacement 02/18/2020 and right total hip replacement 05/12/2020.  Doing well until about 3 weeks ago when she noticed pain and weakness to the left lower extremity.  She feels as though the pain has settled to the left knee.  She also notes some tightness to her back.  She has been using heating pad and taking baclofen  which has relieved some of her symptoms.  She denies any paresthesias to the left lower extremity.  No fevers or chills.  No previous lumbar pathology or pain.  Review of Systems as detailed in HPI.  All others reviewed and are negative.   Objective: Vital Signs: There were no vitals taken for this visit.  Physical Exam well-developed and well-nourished female no acute distress.  Alert and oriented x3.  Ortho Exam examination of both hips reveal painless LOGROLL.  Moderately positive straight leg raise on the left.  No focal weakness.  She is neurovascular intact distally.  Left knee exam shows range of motion from 0 to 120 degrees.  No joint line tenderness.  No swelling.    Specialty Comments:  No specialty comments available.  Imaging: XR KNEE 3 VIEW LEFT  Result Date: 11/04/2020 Mild to moderate degenerative changes all 3 compartments  XR Lumbar Spine 2-3 Views  Result Date: 11/04/2020 The L4 vertebra has eroded into the anterior superior vertebral body of L5.   XR Pelvis 1-2 Views  Result Date: 11/04/2020 X-rays demonstrate well-seated prosthesis without complication    PMFS History: Patient Active Problem List   Diagnosis Date Noted  Status post total replacement of right hip 05/12/2020   Primary osteoarthritis of right hip 04/25/2020   Status post total replacement of left hip 02/18/2020   Arthritis    Asthma    Bronchitis    Chronic bronchitis (HCC)    Flesh-eating bacteria (HCC)    Foot fracture, left    Bilateral thigh pain 01/29/2020   Essential hypertension 01/29/2020   Past Medical History:  Diagnosis Date   Arthritis    Asthma    Bronchitis    Chronic bronchitis (HCC)    Flesh-eating bacteria (HCC)    [place]   Foot fracture, left    Hypertension    Pneumonia    hx of    PONV (postoperative nausea and vomiting)     Family History  Adopted: Yes  Problem Relation Age of Onset   Heart attack Mother    Cancer Son        osteogenic  carcinoma    Past Surgical History:  Procedure Laterality Date   APPENDECTOMY     left foot surgery      MANDIBLE SURGERY     TONSILLECTOMY     TOTAL HIP ARTHROPLASTY Left 02/18/2020   Procedure: LEFT TOTAL HIP ARTHROPLASTY ANTERIOR APPROACH;  Surgeon: Tarry Kos, MD;  Location: [facility];  Service: Orthopedics;  Laterality: Left;   TOTAL HIP ARTHROPLASTY Right 05/12/2020   Procedure: RIGHT TOTAL HIP ARTHROPLASTY ANTERIOR APPROACH;  Surgeon: Tarry Kos, MD;  Location: [facility];  Service: Orthopedics;  Laterality: Right;   WISDOM TOOTH EXTRACTION     Social History   Occupational History   Not on file  Tobacco Use   Smoking status: Never   Smokeless tobacco: Never  Vaping Use   Vaping Use: Never used  Substance and Sexual Activity   Alcohol use: Never   Drug use: Never   Sexual activity: Not Currently    Partners: Male

## 2020-11-05 ENCOUNTER — Other Ambulatory Visit: Payer: Self-pay

## 2020-11-05 ENCOUNTER — Encounter: Payer: Self-pay | Admitting: Medical-Surgical

## 2020-11-05 ENCOUNTER — Ambulatory Visit: Payer: Federal, State, Local not specified - PPO | Admitting: Medical-Surgical

## 2020-11-05 VITALS — BP 148/80 | HR 87 | Temp 98.7°F | Ht 65.0 in | Wt 215.4 lb

## 2020-11-05 DIAGNOSIS — M545 Low back pain, unspecified: Secondary | ICD-10-CM

## 2020-11-05 DIAGNOSIS — R300 Dysuria: Secondary | ICD-10-CM

## 2020-11-05 LAB — POCT URINALYSIS DIP (CLINITEK)
Bilirubin, UA: NEGATIVE
Blood, UA: NEGATIVE
Glucose, UA: NEGATIVE mg/dL
Ketones, POC UA: NEGATIVE mg/dL
Leukocytes, UA: NEGATIVE
Nitrite, UA: NEGATIVE
POC PROTEIN,UA: NEGATIVE
Spec Grav, UA: 1.02 (ref 1.010–1.025)
Urobilinogen, UA: 0.2 E.U./dL
pH, UA: 5.5 (ref 5.0–8.0)

## 2020-11-05 NOTE — Progress Notes (Signed)
  HPI with pertinent ROS:   CC: possible bladder infection  HPI: Pleasant 60 year old female presenting today for symptoms of dysuria for the past week.  She has had some cloudy urine, intermittent urgency, and intermittent flank pain.  She started taking cranberry pills when her symptoms originally started.  She has been trying to drink more water at work but notes that she does not drink as much as she should due to her work requirements.  She has no difficulties with constipation.  She is not sexually active.  No vaginal odor, discharge, or bleeding, itching or irritation.  No fevers, chills, nausea, or vomiting.  I reviewed the past medical history, family history, social history, surgical history, and allergies today and no changes were needed.  Please see the problem list section below in epic for further details.   Physical exam:   General: Well Developed, well nourished, and in no acute distress.  Neuro: Alert and oriented x3.  HEENT: Normocephalic, atraumatic.  Skin: Warm and dry. Cardiac: Regular rate and rhythm, no murmurs rubs or gallops, no lower extremity edema.  Respiratory: Clear to auscultation bilaterally. Not using accessory muscles, speaking in full sentences.  Impression and Recommendations:    1. Dysuria POCT urinalysis negative for any abnormalities.  Since her symptoms have improved, advised to monitor for worsening.  Recommend continuing to hydrate well and drink water frequently at work.  Can continue cranberry if desired.  Discussed possible self swab for yeast/BV evaluation but she would like to hold off on this since her symptoms are gone today.  Advised patient to return should her symptoms persist without improvement.  Patient verbalized understanding and is agreeable to the plan.  Return if symptoms worsen or fail to improve. ___________________________________________ Thayer Ohm, DNP, APRN, FNP-BC Primary Care and Sports Medicine [facility]  [place]

## 2020-11-19 ENCOUNTER — Encounter: Payer: Federal, State, Local not specified - PPO | Admitting: Orthopaedic Surgery

## 2020-11-19 DIAGNOSIS — M4300 Spondylolysis, site unspecified: Secondary | ICD-10-CM | POA: Diagnosis not present

## 2020-11-19 DIAGNOSIS — M4316 Spondylolisthesis, lumbar region: Secondary | ICD-10-CM | POA: Diagnosis not present

## 2020-12-19 ENCOUNTER — Other Ambulatory Visit: Payer: Self-pay | Admitting: Physician Assistant

## 2020-12-24 IMAGING — DX DG CHEST 1V PORT
1 series · 1 of 1 positions shown · non-contrast
Comparison: Single-view of the chest 08/01/2018.

CLINICAL DATA: Onset nonproductive cough today.

EXAM:
PORTABLE CHEST 1 VIEW

[chest ap]
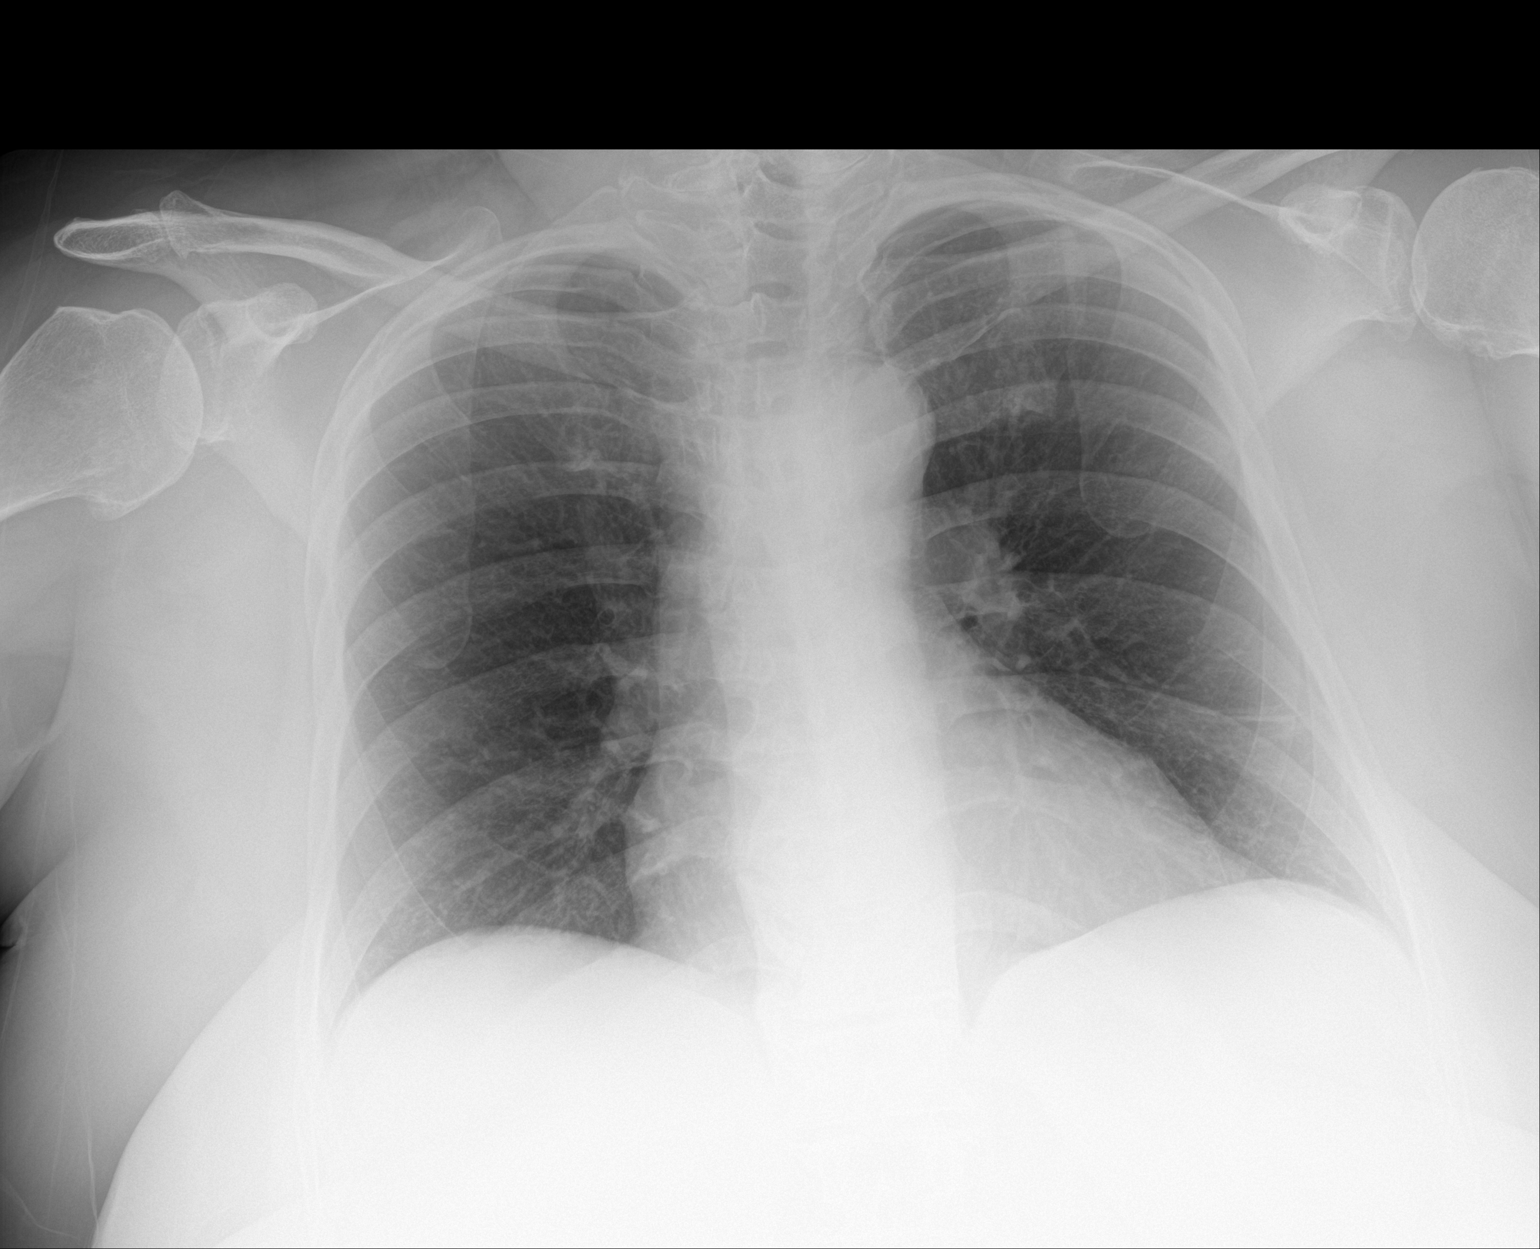

[1 of 1 positions shown; findings below may reference images not displayed]

FINDINGS: Lungs clear. Heart size normal. No pneumothorax or pleural fluid. No
acute or focal bony abnormality.
IMPRESSION: Negative chest.

## 2021-01-11 ENCOUNTER — Other Ambulatory Visit: Payer: Self-pay | Admitting: Medical-Surgical

## 2021-01-19 ENCOUNTER — Other Ambulatory Visit: Payer: Self-pay | Admitting: Medical-Surgical

## 2021-01-19 MED ORDER — PROMETHAZINE HCL 12.5 MG PO TABS: 12.5000 mg | ORAL_TABLET | Freq: Every day | ORAL | 0 refills | Status: DC | PRN

## 2021-01-20 ENCOUNTER — Ambulatory Visit: Payer: Federal, State, Local not specified - PPO | Admitting: Medical-Surgical

## 2021-01-20 ENCOUNTER — Other Ambulatory Visit: Payer: Self-pay

## 2021-01-20 ENCOUNTER — Encounter: Payer: Self-pay | Admitting: Medical-Surgical

## 2021-01-20 VITALS — BP 161/81 | HR 71 | Resp 20 | Ht 65.0 in | Wt 211.0 lb

## 2021-01-20 DIAGNOSIS — Z5320 Procedure and treatment not carried out because of patient's decision for unspecified reasons: Secondary | ICD-10-CM

## 2021-01-20 DIAGNOSIS — F40232 Fear of other medical care: Secondary | ICD-10-CM | POA: Diagnosis not present

## 2021-01-20 DIAGNOSIS — Z2820 Immunization not carried out because of patient decision for unspecified reason: Secondary | ICD-10-CM | POA: Diagnosis not present

## 2021-01-20 DIAGNOSIS — M79604 Pain in right leg: Secondary | ICD-10-CM

## 2021-01-20 MED ORDER — PROMETHAZINE HCL 12.5 MG PO TABS: 12.5000 mg | ORAL_TABLET | Freq: Every day | ORAL | 0 refills | Status: DC | PRN

## 2021-01-20 MED ORDER — HYDROCODONE-ACETAMINOPHEN 5-325 MG PO TABS: 1.0000 | ORAL_TABLET | Freq: Once | ORAL | 0 refills | Status: DC | PRN

## 2021-01-20 MED ORDER — DIAZEPAM 5 MG PO TABS: 5.0000 mg | ORAL_TABLET | Freq: Once | ORAL | 0 refills | Status: DC | PRN

## 2021-01-20 MED ORDER — BACLOFEN 10 MG PO TABS: 10.0000 mg | ORAL_TABLET | Freq: Every evening | ORAL | 1 refills | Status: DC | PRN

## 2021-01-20 NOTE — Progress Notes (Signed)
  HPI with pertinent ROS:   CC: Right leg pain  HPI: Pleasant 60 year old female presenting today for the following:  Right leg pain-has been doing well overall since her hip replacement surgery.  Unfortunately, she has continued to have some right lateral thigh numbness.  She does occasionally have sharp stabbing pains which she equates with healing and nerve regeneration.  Unfortunately, the more healing that happens, the more she is starting to experience some deep right thigh pain both laterally and anteriorly near the midpoint of the femur.  She is extremely active at work and walks 10-20,000 steps per day on average.  Was previously doing quite a bit of walking as well as aquatic therapy at the pool but has cut back on the aquatic therapy as the weather has gotten colder.  Has an upcoming dental procedure and notes that she has severe anxiety when it comes to dental care.  In [place], she reports that she had an incident with a dentist that ended up with her being dispelled from the practice and after that, a routine was developed to help her master her anxiety for long enough to have dental care completed.  She is requesting 1 tablet of Valium 5 mg along with 1 tablet of hydrocodone.  She reports that she takes the Valium just prior to her appointment and after settling in and allowing the medication to start working, she will then take the hydrocodone.  She does have a driver for her procedure and has taken this regimen several times with good results.  I reviewed the past medical history, family history, social history, surgical history, and allergies today and no changes were needed.  Please see the problem list section below in epic for further details.   Physical exam:   General: Well Developed, well nourished, and in no acute distress.  Neuro: Alert and oriented x3.  HEENT: Normocephalic, atraumatic.  Skin: Warm and dry. Cardiac: Regular rate and rhythm, no murmurs rubs or  gallops, no lower extremity edema.  Respiratory: Clear to auscultation bilaterally. Not using accessory muscles, speaking in full sentences.  Impression and Recommendations:    1. Right leg pain Since she is within a year of surgery, would recommend she contact her surgeon regarding healing expectations and further evaluation for her leg pain.  Refilling baclofen to use nightly as needed.  2. Fear of other medical care With her severe level of anxiety, agree that she does need preprocedural anxiolysis.  Since she has had this worked well for her in the past, I will agree to send in 1 tablet of Valium and 1 tablet of Norco as long she has a Government social research officer.  3. Mammogram declined Patient aware of risks versus benefits and has declined breast cancer screening today.  4. Colonoscopy refused Patient aware of risk versus benefits of screening and has declined referral for completion today.  5. Vaccine refused by patient Tdap vaccine due.  Patient declined today.  Aware of risk and benefits of vaccination versus declination.  Return in about 3 months (around 04/22/2021) for general follow up or sooner if needed. ___________________________________________ Thayer Ohm, DNP, APRN, FNP-BC Primary Care and Sports Medicine [facility]

## 2021-02-20 ENCOUNTER — Ambulatory Visit: Payer: Federal, State, Local not specified - PPO | Admitting: Medical-Surgical

## 2021-04-28 ENCOUNTER — Other Ambulatory Visit: Payer: Self-pay

## 2021-04-28 ENCOUNTER — Ambulatory Visit (INDEPENDENT_AMBULATORY_CARE_PROVIDER_SITE_OTHER): Payer: Federal, State, Local not specified - PPO

## 2021-04-28 ENCOUNTER — Ambulatory Visit: Payer: Federal, State, Local not specified - PPO | Admitting: Medical-Surgical

## 2021-04-28 ENCOUNTER — Encounter: Payer: Self-pay | Admitting: Medical-Surgical

## 2021-04-28 VITALS — BP 147/85 | HR 73 | Resp 20 | Ht 65.0 in | Wt 212.7 lb

## 2021-04-28 DIAGNOSIS — M79651 Pain in right thigh: Secondary | ICD-10-CM

## 2021-04-28 DIAGNOSIS — R110 Nausea: Secondary | ICD-10-CM | POA: Diagnosis not present

## 2021-04-28 DIAGNOSIS — M79652 Pain in left thigh: Secondary | ICD-10-CM | POA: Diagnosis not present

## 2021-04-28 DIAGNOSIS — M79645 Pain in left finger(s): Secondary | ICD-10-CM

## 2021-04-28 MED ORDER — ALBUTEROL SULFATE HFA 108 (90 BASE) MCG/ACT IN AERS: 1.0000 | INHALATION_SPRAY | Freq: Four times a day (QID) | RESPIRATORY_TRACT | 0 refills | Status: DC | PRN

## 2021-04-28 MED ORDER — PROMETHAZINE HCL 12.5 MG PO TABS: 12.5000 mg | ORAL_TABLET | Freq: Every day | ORAL | 0 refills | Status: DC | PRN

## 2021-04-28 NOTE — Progress Notes (Signed)
°  HPI with pertinent ROS:   CC: follow up   HPI: Pleasant 61 y.o. here for follow up. She states that she did something to her thumb one month ago. She jammed it and at the time just treated it at home, but she cannot grip anything and is still having pain. She reports pain to touch, but does not have any swelling or discoloration.   Is taking baclofen and phenergan at night. States she takes the phenergan in order to take the baclofen and that both help her to sleep without nausea and feeling like her legs are "jittering." Had hip surgery in December on the left hip and march of last year on the right hip and still has occasional numbness and pain. She has full mobility but states she will sometimes feel places on her hip or leg that "catch."   I reviewed the past medical history, family history, social history, surgical history, and allergies today and no changes were needed.  Please see the problem list section below in epic for further details.   Physical exam:  Physical Exam Constitutional:      Appearance: Normal appearance.  HENT:     Head: Normocephalic and atraumatic.  Cardiovascular:     Rate and Rhythm: Normal rate and regular rhythm.     Pulses: Normal pulses.  Pulmonary:     Effort: Pulmonary effort is normal.     Breath sounds: Normal breath sounds.  Musculoskeletal:     Left hand: Tenderness present. Decreased range of motion.     Comments: Interphalangeal joint and flexor pollicis brevis  Neurological:     Mental Status: She is alert.     Impression and Recommendations:    1. Thumb pain, left Patient would like xray to make sure she did not tear or break something. Encouraged antiinflammatories, heat and ice.  - DG Finger Thumb Left; Future  2. Nausea 3. Bilateral thigh pain Had bilateral hip replacement. Takes phenergan in order to ake baclofen. Doing well with no concern. Discussed trial of PPI to help with nausea, patient would like to continue current  treatment as this is working very well.    Return for pending xray results. ___________________________________________ Albertine Grates, Student NP

## 2021-04-28 NOTE — Progress Notes (Signed)
Medical screening examination/treatment was performed by qualified nurse practitioner student and as supervising provider I was immediately available for consultation/collaboration. I have reviewed documentation and agree with assessment and plan. ° °Yulissa Needham L. Jesse Hirst, DNP, APRN, FNP-BC °[facility] °Primary Care and Sports Medicine ° °

## 2021-05-08 ENCOUNTER — Ambulatory Visit: Payer: Federal, State, Local not specified - PPO | Admitting: Sports Medicine

## 2021-05-08 ENCOUNTER — Other Ambulatory Visit: Payer: Self-pay

## 2021-05-08 DIAGNOSIS — M65312 Trigger thumb, left thumb: Secondary | ICD-10-CM | POA: Insufficient documentation

## 2021-05-08 MED ORDER — MELOXICAM 15 MG PO TABS: ORAL_TABLET | ORAL | 3 refills | Status: DC

## 2021-05-08 NOTE — Progress Notes (Signed)
? ? ?  Procedures performed today:   ? ?None. ? ?Independent interpretation of notes and tests performed by another provider:  ? ?X-rays personally reviewed, for the most part negative, there is a tiny what appears to be old avulsion at the interphalangeal joint, not clinically significant as she is not tender at this location. ? ?Brief History, Exam, Impression, and Recommendations:   ? 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 ?On exam she has tenderness over the flexor pollicis longus at the A1 pulley, visible and palpable triggering. ?We discussed the anatomy and pathophysiology, treatment plan, we will start with NSAIDs, trigger thumb conditioning, return to see me in 4 weeks, flexor pollicis longus tendon sheath injection if not better. ? ? ? ?___________________________________________ ?Ihor Austin. Benjamin Stain, M.D., ABFM., CAQSM. ?Primary Care and Sports Medicine ?[facility] ? ?Adjunct Instructor of Family Medicine  ?[employer] ?

## 2021-05-08 NOTE — Assessment & Plan Note (Signed)
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 ?On exam she has tenderness over the flexor pollicis longus at the A1 pulley, visible and palpable triggering. ?We discussed the anatomy and pathophysiology, treatment plan, we will start with NSAIDs, trigger thumb conditioning, return to see me in 4 weeks, flexor pollicis longus tendon sheath injection if not better. ?

## 2021-05-21 ENCOUNTER — Other Ambulatory Visit: Payer: Self-pay | Admitting: Medical-Surgical

## 2021-06-05 ENCOUNTER — Ambulatory Visit: Payer: Federal, State, Local not specified - PPO | Admitting: Sports Medicine

## 2021-06-22 ENCOUNTER — Ambulatory Visit: Payer: Federal, State, Local not specified - PPO | Admitting: Sports Medicine

## 2021-06-22 DIAGNOSIS — M65312 Trigger thumb, left thumb: Secondary | ICD-10-CM | POA: Diagnosis not present

## 2021-06-22 NOTE — Progress Notes (Signed)
? ? ?  Procedures performed today:   ? ?None. ? ?Independent interpretation of notes and tests performed by another provider:  ? ?None. ? ?Brief History, Exam, Impression, and Recommendations:   ? ?Trigger thumb, left thumb ?Toni Jones returns, she is a very pleasant 61 year old female, she works at the airport, she had a couple of months of increasing pain volar thumb, popping sensations, visible and palpable triggering, we started treatment with conditioning, NSAIDs. ?She has improved dramatically, has almost no pain, very little triggering, happy with how things are going, we will see her back on an as-needed basis. ? ? ? ?___________________________________________ ?Ihor Austin. Benjamin Stain, M.D., ABFM., CAQSM. ?Primary Care and Sports Medicine ?[facility] ? ?Adjunct Instructor of Family Medicine  ?[employer] ?

## 2021-06-22 NOTE — Assessment & Plan Note (Signed)
Toni Jones returns, she is a very pleasant 60 year old female, she works at the [place], she had a couple of months of increasing pain volar thumb, popping sensations, visible and palpable triggering, we started treatment with conditioning, NSAIDs. ?She has improved dramatically, has almost no pain, very little triggering, happy with how things are going, we will see her back on an as-needed basis. ?

## 2021-07-16 ENCOUNTER — Other Ambulatory Visit: Payer: Self-pay | Admitting: Medical-Surgical

## 2021-08-09 ENCOUNTER — Emergency Department
Admission: EM | Admit: 2021-08-09 | Discharge: 2021-08-09 | Disposition: A | Discharge status: Home / Self Care | Payer: Federal, State, Local not specified - PPO | Source: Home / Self Care

## 2021-08-09 ENCOUNTER — Other Ambulatory Visit: Payer: Self-pay

## 2021-08-09 ENCOUNTER — Emergency Department (INDEPENDENT_AMBULATORY_CARE_PROVIDER_SITE_OTHER): Payer: Federal, State, Local not specified - PPO

## 2021-08-09 DIAGNOSIS — R059 Cough, unspecified: Secondary | ICD-10-CM | POA: Diagnosis not present

## 2021-08-09 DIAGNOSIS — R051 Acute cough: Secondary | ICD-10-CM

## 2021-08-09 DIAGNOSIS — J441 Chronic obstructive pulmonary disease with (acute) exacerbation: Secondary | ICD-10-CM

## 2021-08-09 DIAGNOSIS — R053 Chronic cough: Secondary | ICD-10-CM

## 2021-08-09 MED ORDER — DOXYCYCLINE HYCLATE 100 MG PO CAPS
100.0000 mg | ORAL_CAPSULE | Freq: Two times a day (BID) | ORAL | 0 refills | Status: DC
Start: 2021-08-09 — End: 2022-02-26

## 2021-08-09 MED ORDER — IPRATROPIUM-ALBUTEROL 0.5-2.5 (3) MG/3ML IN SOLN
3.0000 mL | Freq: Once | RESPIRATORY_TRACT | Status: AC
  Administered 2021-08-09: 3 mL via RESPIRATORY_TRACT

## 2021-08-09 MED ORDER — PREDNISONE 20 MG PO TABS: 40.0000 mg | ORAL_TABLET | Freq: Every day | ORAL | 0 refills | Status: DC

## 2021-08-09 NOTE — ED Provider Notes (Signed)
[facility]    CSN: [id number] Arrival date & time: 08/09/21  1259      History   Chief Complaint Chief Complaint  Patient presents with   Cough    HPI Toni Jones is a 61 y.o. female.   Patient presents today with a several week history of cough that has worsened significantly in the past several days.  Reports that she had a URI several weeks ago and this improved but never completely resolved.  Over the past several days this is significantly worsened and she is having severe shortness of breath, chest tightness, cough.  She took a COVID test that was negative.  She has had her COVID vaccines.  She does have a history of chronic bronchitis and has been using albuterol inhaler with temporary improvement of symptoms.  Her last inhaler use was several hours ago.  She has also been taking Robitussin without improvement.  Denies any recent steroid or antibiotic use.  She denies history of diabetes.  She is having difficulty with daily duties including work duties and sleeping at night due to severity of cough.  She does not smoke.   Past Medical History:  Diagnosis Date   Arthritis    Asthma    Bronchitis    Chronic bronchitis (HCC)    Flesh-eating bacteria (HCC)    [place]   Foot fracture, left    Hypertension    Pneumonia    hx of    PONV (postoperative nausea and vomiting)     Patient Active Problem List   Diagnosis Date Noted   Trigger thumb, left thumb 05/08/2021   Status post total replacement of right hip 05/12/2020   Primary osteoarthritis of right hip 04/25/2020   Status post total replacement of left hip 02/18/2020   Arthritis    Asthma    Bronchitis    Chronic bronchitis (HCC)    Flesh-eating bacteria (HCC)    Foot fracture, left    Bilateral thigh pain 01/29/2020   Essential hypertension 01/29/2020    Past Surgical History:  Procedure Laterality Date   APPENDECTOMY     left foot surgery      MANDIBLE SURGERY     TONSILLECTOMY      TOTAL HIP ARTHROPLASTY Left 02/18/2020   Procedure: LEFT TOTAL HIP ARTHROPLASTY ANTERIOR APPROACH;  Surgeon: Tarry Kos, MD;  Location: [facility];  Service: Orthopedics;  Laterality: Left;   TOTAL HIP ARTHROPLASTY Right 05/12/2020   Procedure: RIGHT TOTAL HIP ARTHROPLASTY ANTERIOR APPROACH;  Surgeon: Tarry Kos, MD;  Location: [facility];  Service: Orthopedics;  Laterality: Right;   WISDOM TOOTH EXTRACTION      OB History   No obstetric history on file.      Home Medications    Prior to Admission medications   Medication Sig Start Date End Date Taking? Authorizing Provider  baclofen (LIORESAL) 10 MG tablet TAKE 1 TABLET BY MOUTH AT BEDTIME AS NEEDED FOR MUSCLE SPASMS 07/16/21  Yes Christen Butter, NP  doxycycline (VIBRAMYCIN) 100 MG capsule Take 1 capsule (100 mg total) by mouth 2 (two) times daily. 08/09/21  Yes Crestina Strike K, PA-C  guaiFENesin-dextromethorphan (ROBITUSSIN DM) 100-10 MG/5ML syrup Take 5 mLs by mouth every 4 (four) hours as needed for cough.   Yes [provider]  Phenylephrine-Pheniramine-DM [facility] COLD & COUGH) 12-26-18 MG PACK Take by mouth.   Yes [provider]  predniSONE (DELTASONE) 20 MG tablet Take 2 tablets (40 mg total) by mouth daily.  08/09/21  Yes Alabama Doig K, PA-C  albuterol (VENTOLIN HFA) 108 (90 Base) MCG/ACT inhaler INHALE 1-2 PUFFS BY MOUTH EVERY 6 HOURS AS NEEDED FOR WHEEZE OR SHORTNESS OF BREATH 05/21/21   Christen Butter, NP  diazepam (VALIUM) 5 MG tablet Take 1 tablet (5 mg total) by mouth once as needed for up to 1 dose for muscle spasms. Prior to dentist appointments 01/20/21   Christen Butter, NP  HYDROcodone-acetaminophen (NORCO) 5-325 MG tablet Take 1 tablet by mouth once as needed for up to 1 dose for moderate pain. 01/20/21   Christen Butter, NP  promethazine (PHENERGAN) 12.5 MG tablet Take 1 tablet (12.5 mg total) by mouth daily as needed for nausea or vomiting. Prior to dental appointments 04/28/21   Christen Butter, NP    Family  History Family History  Adopted: Yes  Problem Relation Age of Onset   Heart attack Mother    Cancer Son        osteogenic carcinoma    Social History Social History   Tobacco Use   Smoking status: Never   Smokeless tobacco: Never  Vaping Use   Vaping Use: Never used  Substance Use Topics   Alcohol use: Never   Drug use: Never     Allergies   Onion and Red dye #40 [red dye]   Review of Systems Review of Systems  Constitutional:  Positive for activity change. Negative for appetite change, fatigue and fever.  HENT:  Negative for congestion, sinus pressure, sneezing and sore throat.   Respiratory:  Positive for cough, chest tightness and shortness of breath.   Cardiovascular:  Negative for chest pain.  Gastrointestinal:  Negative for abdominal pain, diarrhea, nausea and vomiting.  Neurological:  Negative for dizziness, light-headedness and headaches.    Physical Exam Triage Vital Signs ED Triage Vitals  Enc Vitals Group     BP 08/09/21 1332 (!) 153/97     Pulse Rate 08/09/21 1332 98     Resp 08/09/21 1332 (!) 24     Temp 08/09/21 1332 99.5 F (37.5 C)     Temp Source 08/09/21 1332 Oral     SpO2 08/09/21 1332 97 %     Weight 08/09/21 1326 210 lb (95.3 kg)     Height 08/09/21 1326 5\' 6"  (1.676 m)     Head Circumference --      Peak Flow --      Pain Score 08/09/21 1325 5     Pain Loc --      Pain Edu? --      Excl. in GC? --    No data found.  Updated Vital Signs BP (!) 137/93 (BP Location: Right Arm)   Pulse 98   Temp 99.5 F (37.5 C) (Oral)   Resp (!) 24   Ht 5\' 6"  (1.676 m)   Wt 210 lb (95.3 kg)   SpO2 97%   BMI 33.89 kg/m   Visual Acuity Right Eye Distance:   Left Eye Distance:   Bilateral Distance:    Right Eye Near:   Left Eye Near:    Bilateral Near:     Physical Exam Vitals reviewed.  Constitutional:      General: She is awake. She is not in acute distress.    Appearance: Normal appearance. She is well-developed. She is not  ill-appearing.     Comments: Very pleasant female appears stated age in no acute distress sitting comfortably in exam room  HENT:     Head: Normocephalic and  atraumatic.     Right Ear: Tympanic membrane, ear canal and external ear normal. Tympanic membrane is not erythematous or bulging.     Left Ear: Tympanic membrane, ear canal and external ear normal. Tympanic membrane is not erythematous or bulging.     Nose:     Right Sinus: No maxillary sinus tenderness or frontal sinus tenderness.     Left Sinus: No maxillary sinus tenderness or frontal sinus tenderness.     Mouth/Throat:     Pharynx: Uvula midline. Posterior oropharyngeal erythema present. No oropharyngeal exudate.  Cardiovascular:     Rate and Rhythm: Normal rate and regular rhythm.     Heart sounds: Normal heart sounds, S1 normal and S2 normal. No murmur heard. Pulmonary:     Effort: Pulmonary effort is normal.     Breath sounds: Wheezing and rhonchi present. No rales.     Comments: Widespread wheezing and rhonchi partially clear with cough. Lymphadenopathy:     Head:     Right side of head: No submental, submandibular or tonsillar adenopathy.     Left side of head: No submental, submandibular or tonsillar adenopathy.     Cervical: No cervical adenopathy.  Psychiatric:        Behavior: Behavior is cooperative.     UC Treatments / Results  Labs (all labs ordered are listed, but only abnormal results are displayed) Labs Reviewed - No data to display  EKG   Radiology DG Chest 2 View  Result Date: 08/09/2021 CLINICAL DATA:  Cough for 2 weeks. EXAM: CHEST - 2 VIEW COMPARISON:  02/15/2020 and prior studies FINDINGS: The cardiomediastinal silhouette is unremarkable. Mild chronic peribronchial thickening again noted. There is no evidence of focal airspace disease, pulmonary edema, suspicious pulmonary nodule/mass, pleural effusion, or pneumothorax. No acute bony abnormalities are identified. IMPRESSION: No active  cardiopulmonary disease. Electronically Signed   By: Harmon PierJeffrey  Hu M.D.   On: 08/09/2021 14:10    Procedures Procedures (including critical care time)  Medications Ordered in UC Medications  ipratropium-albuterol (DUONEB) 0.5-2.5 (3) MG/3ML nebulizer solution 3 mL (3 mLs Nebulization Given 08/09/21 1405)    Initial Impression / Assessment and Plan / UC Course  I have reviewed the triage vital signs and the nursing notes.  Pertinent labs & imaging results that were available during my care of the patient were reviewed by me and considered in my medical decision making (see chart for details).     No indication for viral testing as patient has been symptomatic for several weeks.  Chest x-ray was obtained that showed no acute cardiopulmonary disease.  Concern for COPD exacerbation.  Patient was given DuoNeb in clinic with improvement but not resolution of symptoms.  Recommend that she use albuterol at home every 4-6 hours as needed for shortness of breath and coughing fits.  She is to start prednisone burst with instructions to take NSAIDs with this medication due to risk of GI bleeding.  Will cover for COPD exacerbation with doxycycline 100 mg twice daily.  She was instructed to avoid prolonged sun exposure due to photosensitivity associated with this medication.  She can use over-the-counter medications including Tylenol, Mucinex, Flonase.  She is to rest and drink plenty of fluid.  Discussed that if symptoms or not improving by next week she needs to return for reevaluation.  If she has any worsening symptoms including high fever and or swelling to medication, chest pain, shortness of breath, worsening cough, weakness, nausea/vomiting she needs to be seen immediately to which  she expressed understanding.  Work excuse note provided.  Final Clinical Impressions(s) / UC Diagnoses   Final diagnoses:  COPD exacerbation (HCC)  Acute cough     Discharge Instructions      Your chest x-ray was  normal.  I am concerned that you have a flare of your chronic bronchitis.  Please continue using your albuterol inhaler.  Start prednisone burst.  Do not take NSAIDs including aspirin, ibuprofen/Advil, naproxen/Aleve with this medication as it can cause stomach bleeding.  You can use Tylenol, Mucinex, Flonase for symptom relief.  Start doxycycline 100 mg twice daily for 10 days.  Stay out of the sun while on this medication.  Make sure you rest and drink plenty of fluid.  If your symptoms or not improving or if anything worsens you need to return for reevaluation as we discussed.     ED Prescriptions     Medication Sig Dispense Auth. Provider   predniSONE (DELTASONE) 20 MG tablet Take 2 tablets (40 mg total) by mouth daily. 8 tablet Trevone Prestwood K, PA-C   doxycycline (VIBRAMYCIN) 100 MG capsule Take 1 capsule (100 mg total) by mouth 2 (two) times daily. 20 capsule Yosiah Jasmin, Noberto Retort, PA-C      PDMP not reviewed this encounter.   Jeani Hawking, PA-C 08/09/21 1430

## 2021-08-09 NOTE — ED Triage Notes (Signed)
Pt presents to Urgent Care with c/o cough 2 weeks ago which resolved after 4 days; now w/ cough w/ sob since last night. States she feels like she has bronchitis. Negative home COVID test.

## 2021-08-09 NOTE — Discharge Instructions (Signed)
Your chest x-ray was normal.  I am concerned that you have a flare of your chronic bronchitis.  Please continue using your albuterol inhaler.  Start prednisone burst.  Do not take NSAIDs including aspirin, ibuprofen/Advil, naproxen/Aleve with this medication as it can cause stomach bleeding.  You can use Tylenol, Mucinex, Flonase for symptom relief.  Start doxycycline 100 mg twice daily for 10 days.  Stay out of the sun while on this medication.  Make sure you rest and drink plenty of fluid.  If your symptoms or not improving or if anything worsens you need to return for reevaluation as we discussed.

## 2022-01-08 IMAGING — DX DG CHEST 2V
2 series · 2 of 2 positions shown · non-contrast
Comparison: 10/09/2019

CLINICAL DATA: Preoperative respiratory exam for left hip
arthroplasty. History of asthma and hypertension.

EXAM:
CHEST - 2 VIEW

[chest pa]
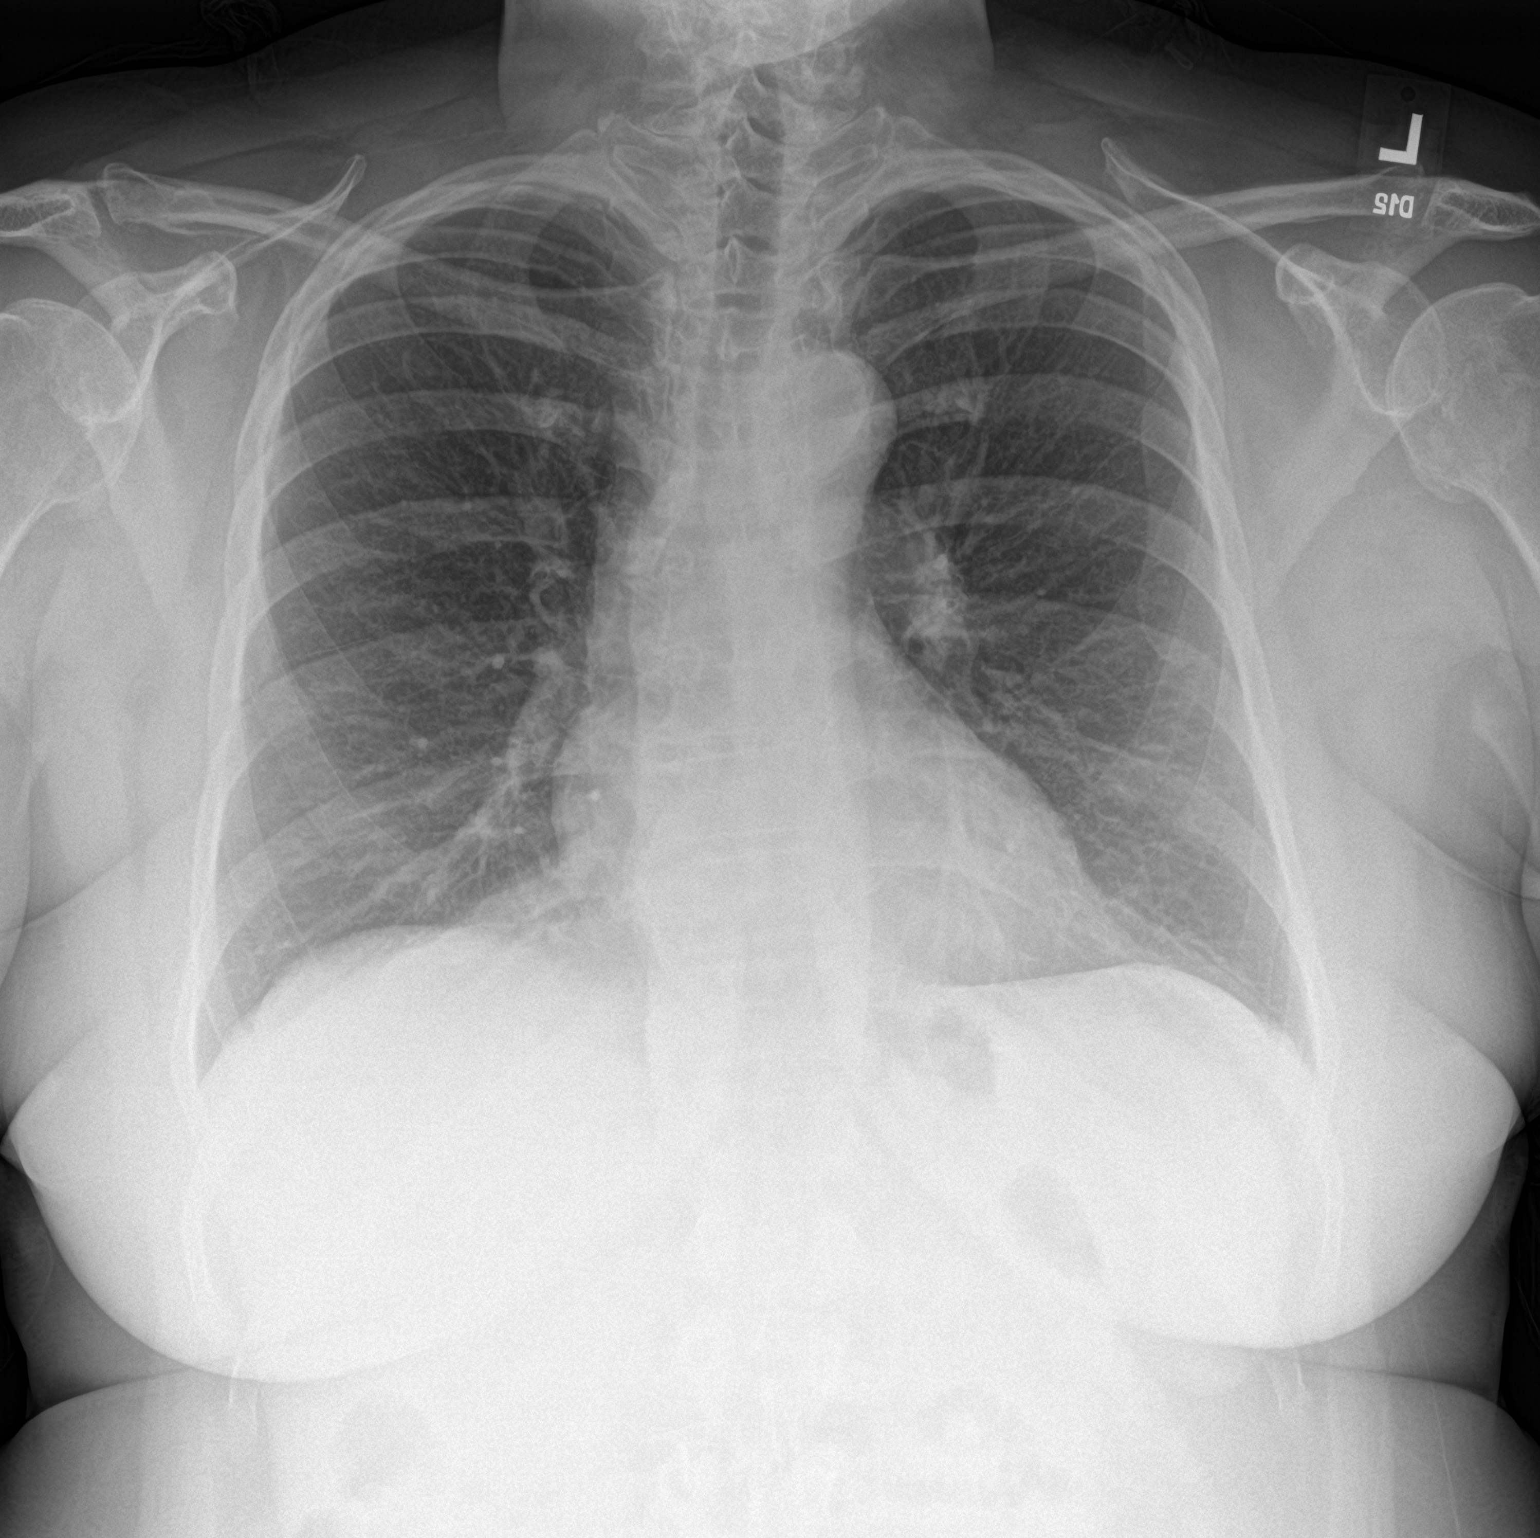

[chest lat]
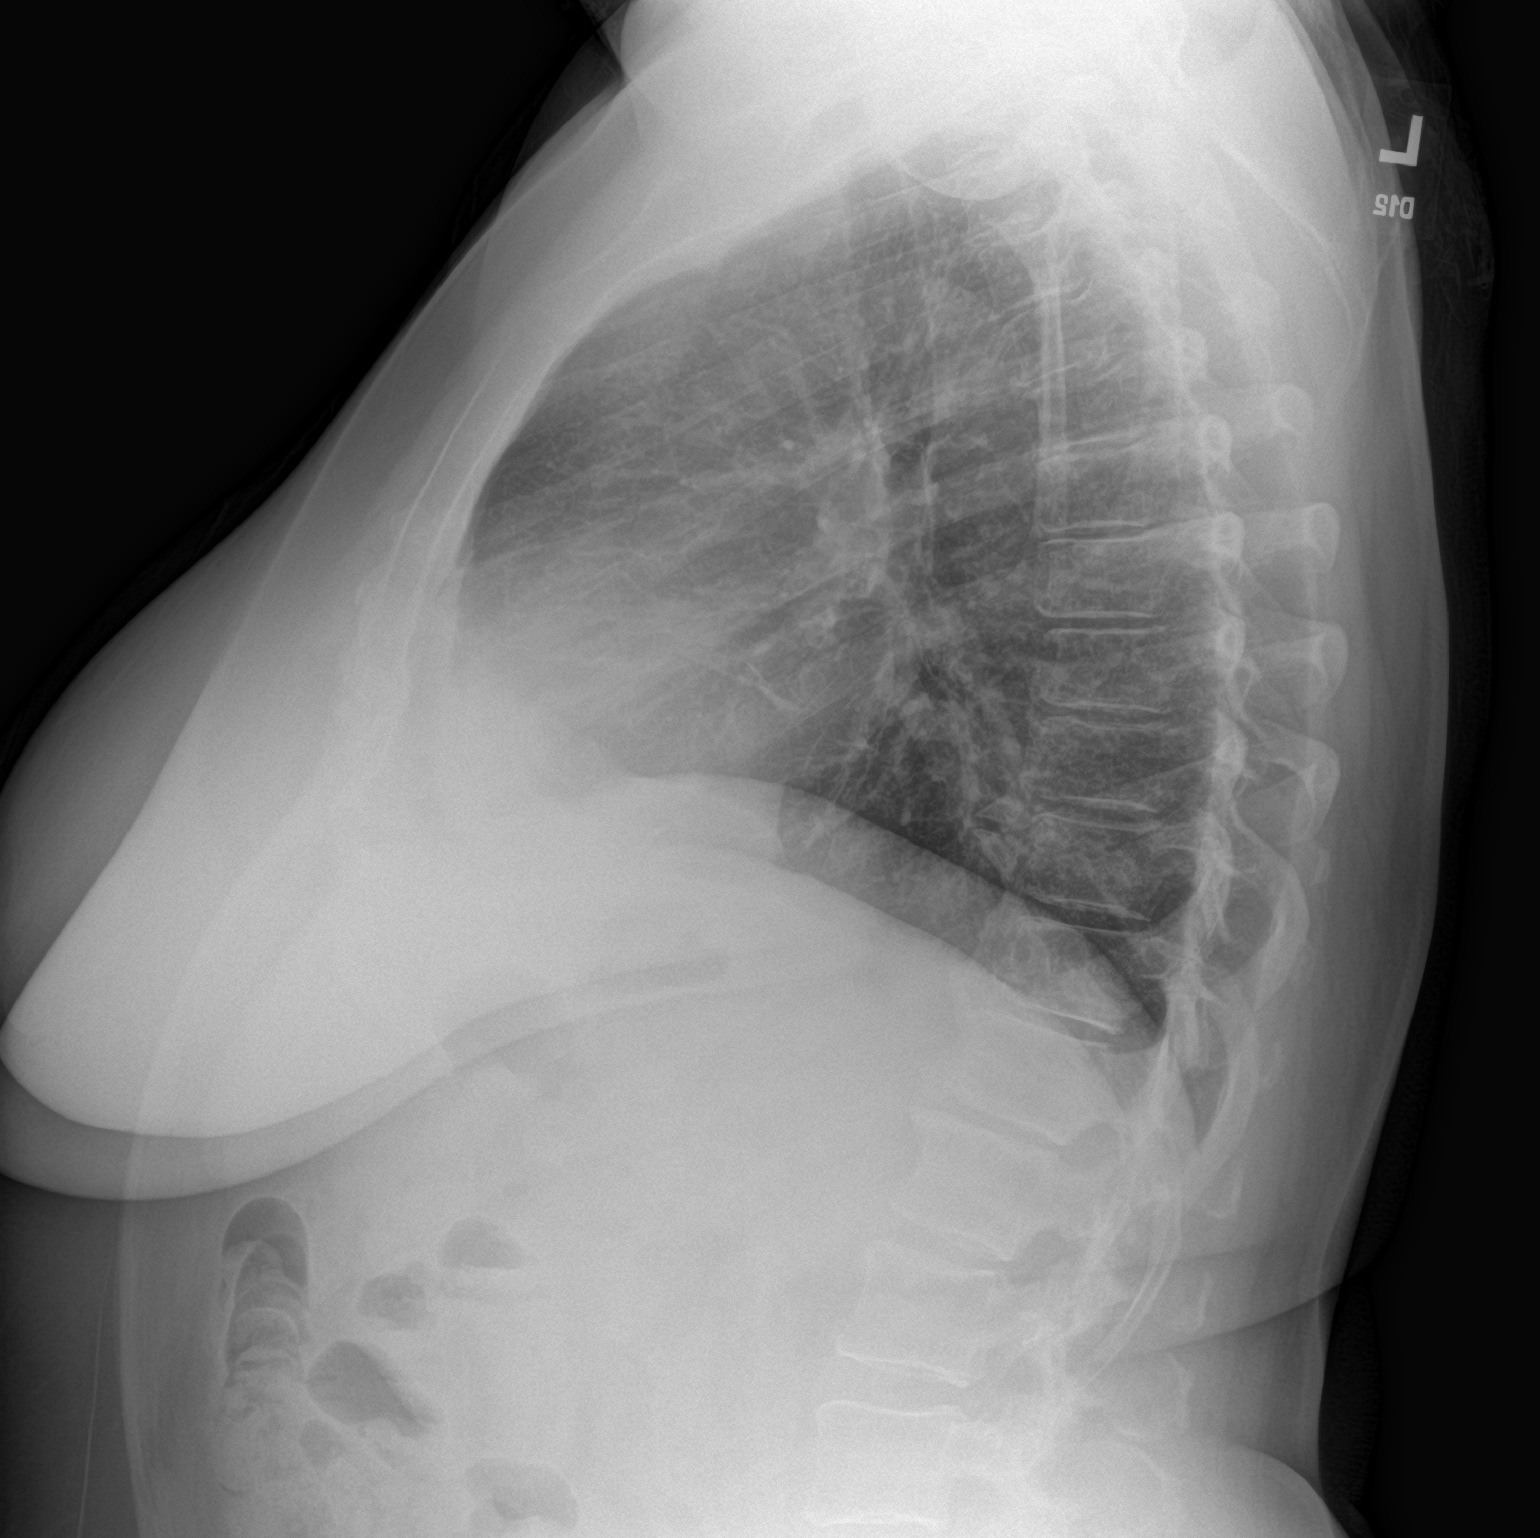

[2 of 2 positions shown; findings below may reference images not displayed]

FINDINGS: Heart size is normal. Mild atherosclerotic calcification of the
aorta. Lungs are clear. The vascularity is normal. No effusions. No
significant bone finding.
IMPRESSION: No active disease. Mild aortic atherosclerosis.

## 2022-02-26 ENCOUNTER — Ambulatory Visit
Admission: EM | Admit: 2022-02-26 | Discharge: 2022-02-26 | Disposition: A | Discharge status: Home / Self Care | Payer: Federal, State, Local not specified - PPO | Attending: Urgent Care | Admitting: Urgent Care

## 2022-02-26 ENCOUNTER — Other Ambulatory Visit: Payer: Self-pay | Admitting: Medical-Surgical

## 2022-02-26 ENCOUNTER — Encounter: Payer: Self-pay | Admitting: Emergency Medicine

## 2022-02-26 DIAGNOSIS — N390 Urinary tract infection, site not specified: Secondary | ICD-10-CM | POA: Insufficient documentation

## 2022-02-26 DIAGNOSIS — R030 Elevated blood-pressure reading, without diagnosis of hypertension: Secondary | ICD-10-CM | POA: Diagnosis not present

## 2022-02-26 LAB — POCT URINALYSIS DIP (MANUAL ENTRY)
Bilirubin, UA: NEGATIVE
Glucose, UA: NEGATIVE mg/dL
Ketones, POC UA: NEGATIVE mg/dL
Leukocytes, UA: NEGATIVE
Nitrite, UA: POSITIVE — AB
Protein Ur, POC: >=300 mg/dL — AB
Spec Grav, UA: >=1.03 — AB (ref 1.010–1.025)
Urobilinogen, UA: 0.2 E.U./dL
pH, UA: 7 (ref 5.0–8.0)

## 2022-02-26 MED ORDER — SULFAMETHOXAZOLE-TRIMETHOPRIM 800-160 MG PO TABS: 1.0000 | ORAL_TABLET | Freq: Two times a day (BID) | ORAL | 0 refills | Status: AC

## 2022-02-26 NOTE — ED Provider Notes (Addendum)
[facility]    CSN: [id number] Arrival date & time: 02/26/22  6759      History   Chief Complaint Chief Complaint  Patient presents with   Urinary Frequency    HPI Toni Jones is a 61 y.o. female.   Pleasant 61 year old female presents today with concern of UTI.  States over the past 2 days she has been having frequency hesitancy dysuria and burning.  Reports 4 days ago she had flank pain, but this has since resolved.  Has history of UTIs and states this feels similar.  Denies any visible hematuria.  No pelvic pain or discharge.  No itching.  Has been taking over-the-counter cranberry without resolution of symptoms.  No fevers.   Urinary Frequency    Past Medical History:  Diagnosis Date   Arthritis    Asthma    Bronchitis    Chronic bronchitis (HCC)    Flesh-eating bacteria (HCC)    [place]   Foot fracture, left    Hypertension    Pneumonia    hx of    PONV (postoperative nausea and vomiting)     Patient Active Problem List   Diagnosis Date Noted   Trigger thumb, left thumb 05/08/2021   Status post total replacement of right hip 05/12/2020   Primary osteoarthritis of right hip 04/25/2020   Status post total replacement of left hip 02/18/2020   Arthritis    Asthma    Bronchitis    Chronic bronchitis (HCC)    Flesh-eating bacteria (HCC)    Foot fracture, left    Bilateral thigh pain 01/29/2020   Essential hypertension 01/29/2020    Past Surgical History:  Procedure Laterality Date   APPENDECTOMY     left foot surgery      MANDIBLE SURGERY     TONSILLECTOMY     TOTAL HIP ARTHROPLASTY Left 02/18/2020   Procedure: LEFT TOTAL HIP ARTHROPLASTY ANTERIOR APPROACH;  Surgeon: Tarry Kos, MD;  Location: [facility];  Service: Orthopedics;  Laterality: Left;   TOTAL HIP ARTHROPLASTY Right 05/12/2020   Procedure: RIGHT TOTAL HIP ARTHROPLASTY ANTERIOR APPROACH;  Surgeon: Tarry Kos, MD;  Location: [facility];  Service: Orthopedics;  Laterality:  Right;   WISDOM TOOTH EXTRACTION      OB History   No obstetric history on file.      Home Medications    Prior to Admission medications   Medication Sig Start Date End Date Taking? Authorizing Provider  albuterol (VENTOLIN HFA) 108 (90 Base) MCG/ACT inhaler INHALE 1-2 PUFFS BY MOUTH EVERY 6 HOURS AS NEEDED FOR WHEEZE OR SHORTNESS OF BREATH 05/21/21  Yes Jessup, Joy, NP  baclofen (LIORESAL) 10 MG tablet TAKE 1 TABLET BY MOUTH AT BEDTIME AS NEEDED FOR MUSCLE SPASMS 07/16/21  Yes Christen Butter, NP  diazepam (VALIUM) 5 MG tablet Take 1 tablet (5 mg total) by mouth once as needed for up to 1 dose for muscle spasms. Prior to dentist appointments 01/20/21  Yes Christen Butter, NP  sulfamethoxazole-trimethoprim (BACTRIM DS) 800-160 MG tablet Take 1 tablet by mouth 2 (two) times daily for 5 days. 02/26/22 03/03/22 Yes Antinette Keough, Jodelle Gross, PA    Family History Family History  Adopted: Yes  Problem Relation Age of Onset   Heart attack Mother    Cancer Son        osteogenic carcinoma    Social History Social History   Tobacco Use   Smoking status: Never   Smokeless tobacco: Never  Vaping Use  Vaping Use: Never used  Substance Use Topics   Alcohol use: Never   Drug use: Never     Allergies   Onion and Red dye #40 [red dye]   Review of Systems Review of Systems  Genitourinary:  Positive for frequency.  As per HPI   Physical Exam Triage Vital Signs ED Triage Vitals  Enc Vitals Group     BP 02/26/22 1028 (!) 170/94     Pulse Rate 02/26/22 1028 70     Resp 02/26/22 1028 18     Temp 02/26/22 1028 98.7 F (37.1 C)     Temp Source 02/26/22 1028 Oral     SpO2 02/26/22 1028 96 %     Weight 02/26/22 1030 200 lb (90.7 kg)     Height 02/26/22 1030 5\' 6"  (1.676 m)     Head Circumference --      Peak Flow --      Pain Score 02/26/22 1030 3     Pain Loc --      Pain Edu? --      Excl. in GC? --    No data found.  Updated Vital Signs BP (!) 170/94 (BP Location: Right Arm)    Pulse 70   Temp 98.7 F (37.1 C) (Oral)   Resp 18   Ht 5\' 6"  (1.676 m)   Wt 200 lb (90.7 kg)   SpO2 96%   BMI 32.28 kg/m   Visual Acuity Right Eye Distance:   Left Eye Distance:   Bilateral Distance:    Right Eye Near:   Left Eye Near:    Bilateral Near:     Physical Exam Vitals and nursing note reviewed.  Constitutional:      General: She is not in acute distress.    Appearance: Normal appearance. She is normal weight. She is not ill-appearing, toxic-appearing or diaphoretic.  HENT:     Head: Normocephalic and atraumatic.  Eyes:     Pupils: Pupils are equal, round, and reactive to light.  Cardiovascular:     Rate and Rhythm: Normal rate.     Pulses: Normal pulses.     Heart sounds: Normal heart sounds. No murmur heard.    No friction rub. No gallop.  Pulmonary:     Effort: Pulmonary effort is normal. No respiratory distress.     Breath sounds: Normal breath sounds. No wheezing or rales.  Chest:     Chest wall: No tenderness.  Abdominal:     General: Abdomen is flat. Bowel sounds are normal. There is no distension.     Palpations: Abdomen is soft. There is no mass.     Tenderness: There is no abdominal tenderness. There is no right CVA tenderness, left CVA tenderness, guarding or rebound.     Hernia: No hernia is present.  Musculoskeletal:     Right lower leg: No edema.     Left lower leg: No edema.  Skin:    General: Skin is warm.     Findings: No bruising, erythema or rash.  Neurological:     General: No focal deficit present.     Mental Status: She is alert. Mental status is at baseline.  Psychiatric:        Mood and Affect: Mood normal.      UC Treatments / Results  Labs (all labs ordered are listed, but only abnormal results are displayed) Labs Reviewed  POCT URINALYSIS DIP (MANUAL ENTRY) - Abnormal; Notable for the following components:  Result Value   Clarity, UA turbid (*)    Spec Grav, UA >=1.030 (*)    Blood, UA large (*)    Protein Ur,  POC >=300 (*)    Nitrite, UA Positive (*)    All other components within normal limits  URINE CULTURE    EKG   Radiology No results found.  Procedures Procedures (including critical care time)  Medications Ordered in UC Medications - No data to display  Initial Impression / Assessment and Plan / UC Course  I have reviewed the triage vital signs and the nursing notes.  Pertinent labs & imaging results that were available during my care of the patient were reviewed by me and considered in my medical decision making (see chart for details).     UTI -UA dipstick consistent with urinary tract infection.  Patient denying symptoms of pyelonephritis.  Patient clinically stable.  Patient has a red dye allergy which interacts with both Macrobid and Pyridium.  Will discharge home with Bactrim.  No sulfa allergies known.  Encouraged increase hydration.  Will call with results of urine culture if change in treatment necessary. Elevated BP reading -elevated blood pressure in office.  Please monitor that at home, goal blood pressure close to 120/80.  Follow-up with PCP if it remains elevated.  Final Clinical Impressions(s) / UC Diagnoses   Final diagnoses:  Lower urinary tract infectious disease  Elevated blood pressure reading     Discharge Instructions       Your symptoms are consistent with a urinary tract infection. Start taking the antibiotic twice daily until completed.  Pyridium unfortunately interacts with your red dye allergy thus cannot be used.  We will send out your urine culture and only call if a change in treatment is necessary. Monitor for any change in or worsening symptoms; fever, hematuria or flank pain would warrant a recheck     ED Prescriptions     Medication Sig Dispense Auth. Provider   sulfamethoxazole-trimethoprim (BACTRIM DS) 800-160 MG tablet Take 1 tablet by mouth 2 (two) times daily for 5 days. 10 tablet Dara Beidleman L, [place]      PDMP not  reviewed this encounter.   Maretta Bees, [place] 02/26/22 1114    Maretta Bees, [place] 02/26/22 1115

## 2022-02-26 NOTE — Discharge Instructions (Addendum)
  Your symptoms are consistent with a urinary tract infection. Start taking the antibiotic twice daily until completed.  Pyridium unfortunately interacts with your red dye allergy thus cannot be used.  We will send out your urine culture and only call if a change in treatment is necessary. Monitor for any change in or worsening symptoms; fever, hematuria or flank pain would warrant a recheck

## 2022-02-26 NOTE — ED Triage Notes (Signed)
Patient c/o urinary frequency and urgency since yesterday.  Denies any hematuria.  Cloudy urine.  Patient has taken Cranberry pills.

## 2022-02-28 LAB — URINE CULTURE: Culture: >=100000 — AB

## 2022-04-05 IMAGING — RF DG HIP (WITH PELVIS) OPERATIVE*R*
1 series · 2 of 2 positions shown · non-contrast
Comparison: 02/18/2020.

CLINICAL DATA: Right anterior total hip arthroplasty.

EXAM:
DG C-ARM 1-60 MIN; OPERATIVE RIGHT HIP WITH PELVIS
FLUOROSCOPY TIME:  Fluoroscopy Time:  29 seconds.
Radiation Exposure Index (if provided by the fluoroscopic device):
4.4 mGy.
Number of Acquired Spot Images: 2

[Series 1: unknown protocol · 0.20mm/px · 2 of 2 slices shown]
[im 1/2]
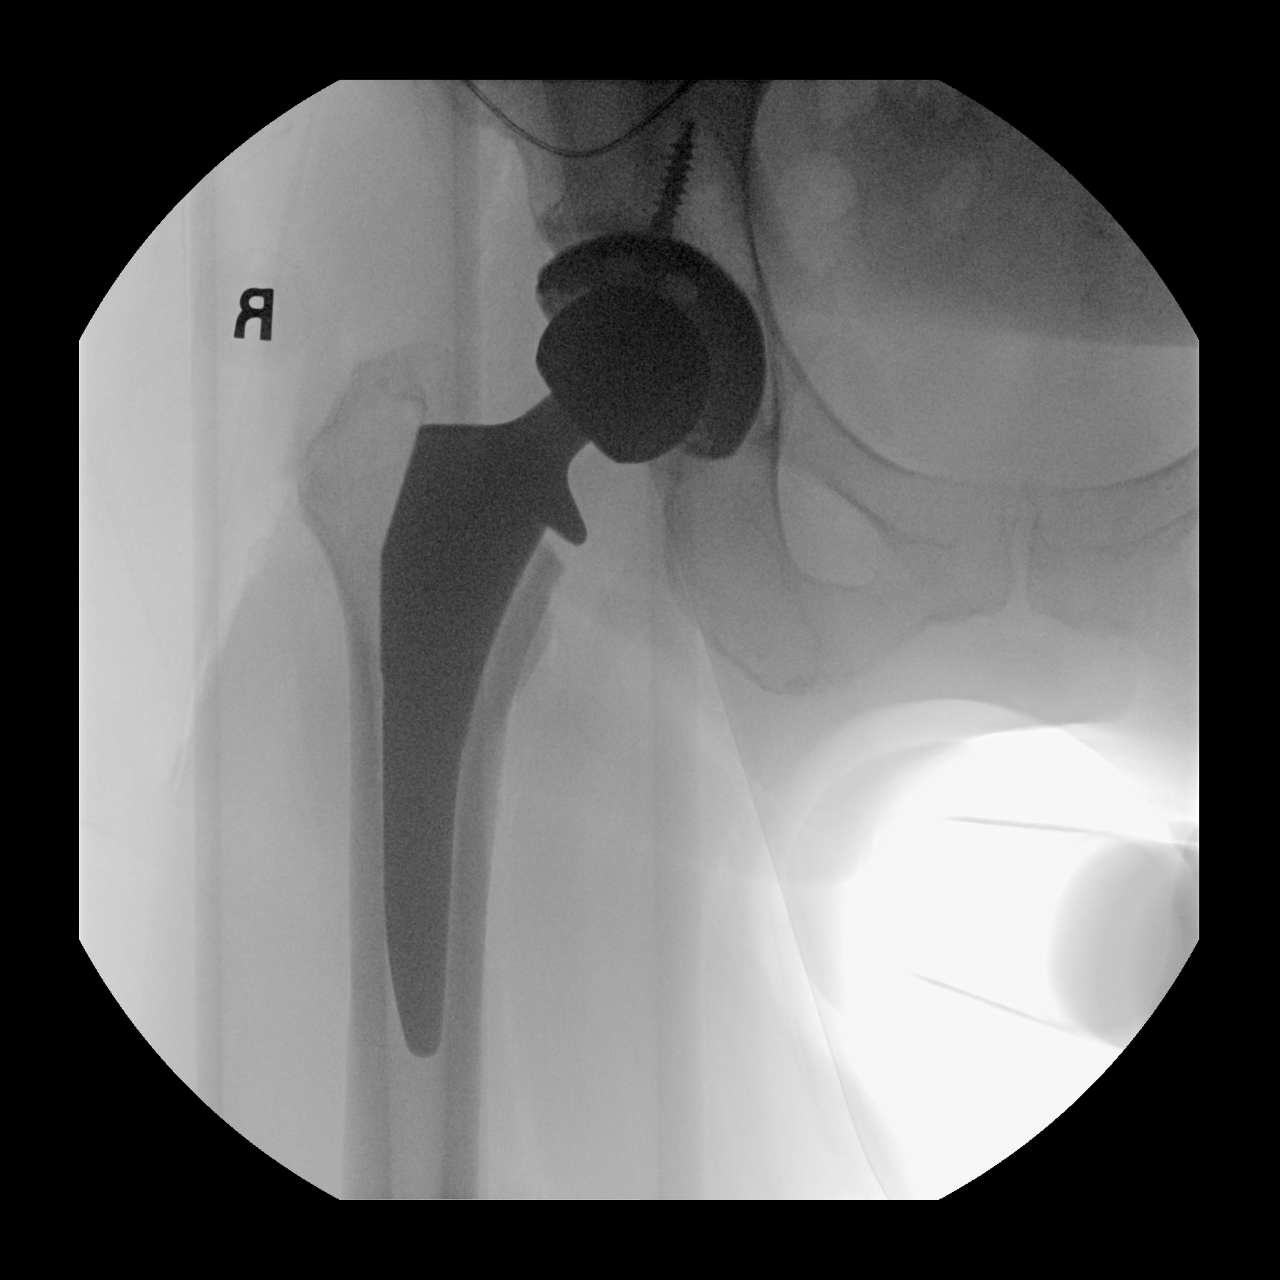
[im 2/2]
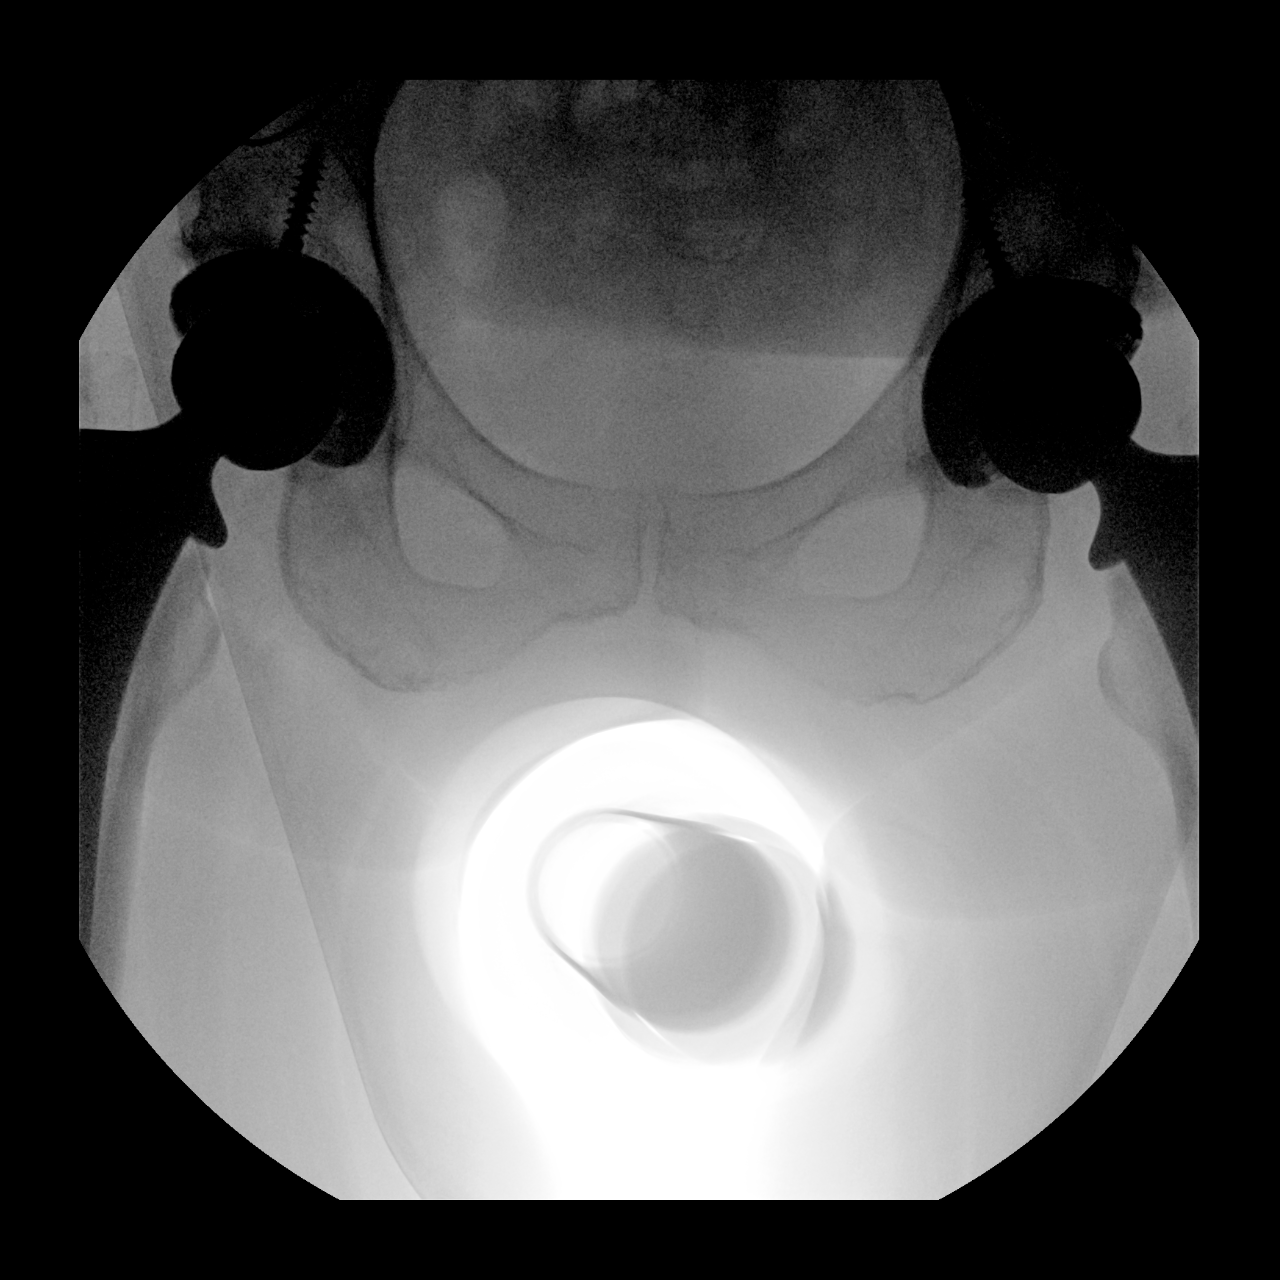

[2 of 2 positions shown; findings below may reference images not displayed]

FINDINGS: Two C-arm fluoroscopic images were obtained intraoperatively and
submitted for post operative interpretation. These images
demonstrate postsurgical changes of right total hip arthroplasty.
Partially imaged prior left total hip arthroplasty. No unexpected
findings. Please see the performing provider's procedural report for
further detail.
IMPRESSION: Right total hip arthroplasty.

## 2022-05-13 ENCOUNTER — Telehealth: Payer: Self-pay

## 2022-05-13 NOTE — Telephone Encounter (Signed)
Patient called requesting 2 Vicodin, 2 Valium, and 2 phenergan because she has 2 dental appointments coming up.   1 appointment is the week of the 11 th through the 67 th and the other appointment is the 18 th through the 23 rd.  She was wanting to know if she needs to make an appointment or can you send those pills into the pharmacy for her.

## 2022-05-13 NOTE — Telephone Encounter (Signed)
After discussion with my supervising provider, recommend she contact her dentist for preprocedural anxiety treatment.

## 2022-05-13 NOTE — Telephone Encounter (Signed)
LMVM for the patient to contact her dentist for pre procedural anxiety treatment.

## 2022-05-18 ENCOUNTER — Other Ambulatory Visit: Payer: Self-pay | Admitting: Medical-Surgical

## 2022-06-16 ENCOUNTER — Telehealth: Payer: Self-pay

## 2022-06-16 NOTE — Telephone Encounter (Signed)
Patient came by office to inform that she has a dentist appointment next Thursday by Mickie Kay at [facility], and states that she would like medication to take during her visit, the patients dentist phone number is [phone], and the medications are phenergen, vicodin, and valium.

## 2022-06-16 NOTE — Telephone Encounter (Signed)
As before, any pre procedural medication should be manage by the dentist office. I have filled it in the past but this is not recommended as a routine regimen. Guidelines are pretty specific about avoiding narcotics and benzodiazepines together. I have discussed this with my supervising provider and he agrees with the above information.

## 2022-06-21 MED ORDER — DIAZEPAM 5 MG PO TABS: 5.0000 mg | ORAL_TABLET | Freq: Once | ORAL | 0 refills | Status: AC | PRN

## 2022-06-21 NOTE — Telephone Encounter (Signed)
I called and spoke to her dentist office as requested.  After discussion, it looks like they use Valium for preprocedural medication and are familiar with the monitoring that needs to occur when using that medication.  I will be glad to send in a dose of Valium but am unable to prescribe the hydrocodone and promethazine along with it.  If she would like me to send the Valium, please verify what pharmacy.

## 2022-06-21 NOTE — Telephone Encounter (Signed)
Valium sent to the pharmacy. Valium also work to help treat and prevent nausea so that should do the trick.  ___________________________________________ Thayer Ohm, DNP, APRN, FNP-BC Primary Care and Sports Medicine [facility] [place]

## 2023-01-07 DIAGNOSIS — K080 Exfoliation of teeth due to systemic causes: Secondary | ICD-10-CM | POA: Diagnosis not present

## 2023-09-30 ENCOUNTER — Ambulatory Visit
Admission: EM | Admit: 2023-09-30 | Discharge: 2023-09-30 | Disposition: A | Discharge status: Home / Self Care | Attending: Family Medicine | Admitting: Family Medicine

## 2023-09-30 DIAGNOSIS — N3001 Acute cystitis with hematuria: Secondary | ICD-10-CM | POA: Insufficient documentation

## 2023-09-30 LAB — POCT URINALYSIS DIP (MANUAL ENTRY)
Glucose, UA: 250 mg/dL — AB
Nitrite, UA: POSITIVE — AB
Protein Ur, POC: >=300 mg/dL — AB
Spec Grav, UA: 1.01 (ref 1.010–1.025)
Urobilinogen, UA: >=8 U/dL — AB
pH, UA: 5 (ref 5.0–8.0)

## 2023-09-30 MED ORDER — CEFDINIR 300 MG PO CAPS: 300.0000 mg | ORAL_CAPSULE | Freq: Two times a day (BID) | ORAL | 0 refills | Status: AC

## 2023-09-30 NOTE — Discharge Instructions (Addendum)
 Advised patient of UA results and to take medication as directed with food to completion.  Encouraged to increase daily water  intake to 64 ounces per day while taking this medication.  Advised we will follow-up with urine culture results once received.  Advised if symptoms worsen and/or unresolved please follow-up with your PCP, [facility] urology, or here for further evaluation.

## 2023-09-30 NOTE — ED Provider Notes (Signed)
 Toni Jones    CSN: [id number] Arrival date & time: 09/30/23  0857      History   Chief Complaint Chief Complaint  Patient presents with   Dysuria    HPI Toni Jones is a 63 y.o. female.   HPI 63 year old female presents with dysuria and urinary frequency for 3 days.  Reports onset of chills last night and took OTC AZO. PMH significant for obesity, HTN, and chronic bronchitis.  Past Medical History:  Diagnosis Date   Arthritis    Asthma    Bronchitis    Chronic bronchitis (HCC)    Flesh-eating bacteria (HCC[place]   Foot fracture, left    Hypertension    Pneumonia    hx of    PONV (postoperative nausea and vomiting)     Patient Active Problem List   Diagnosis Date Noted   Trigger thumb, left thumb 05/08/2021   Status post total replacement of right hip 05/12/2020   Primary osteoarthritis of right hip 04/25/2020   Status post total replacement of left hip 02/18/2020   Arthritis    Asthma    Bronchitis    Chronic bronchitis (HCC)    Flesh-eating bacteria (HCC)    Foot fracture, left    Bilateral thigh pain 01/29/2020   Essential hypertension 01/29/2020    Past Surgical History:  Procedure Laterality Date   APPENDECTOMY     left foot surgery      MANDIBLE SURGERY     TONSILLECTOMY     TOTAL HIP ARTHROPLASTY Left 02/18/2020   Procedure: LEFT TOTAL HIP ARTHROPLASTY ANTERIOR APPROACH;  Surgeon: Toni Kay HERO, MD;  Location: [facility];  Service: Orthopedics;  Laterality: Left;   TOTAL HIP ARTHROPLASTY Right 05/12/2020   Procedure: RIGHT TOTAL HIP ARTHROPLASTY ANTERIOR APPROACH;  Surgeon: Toni Kay HERO, MD;  Location: [facility];  Service: Orthopedics;  Laterality: Right;   WISDOM TOOTH EXTRACTION      OB History   No obstetric history on file.      Home Medications    Prior to Admission medications   Medication Sig Start Date End Date Taking? Authorizing Provider  cefdinir (OMNICEF) 300 MG capsule Take 1 capsule (300 mg total) by  mouth 2 (two) times daily for 7 days. 09/30/23 10/07/23 Yes Toni Delangel, FNP  albuterol  (VENTOLIN  HFA) 108 (90 Base) MCG/ACT inhaler INHALE 1-2 PUFFS BY MOUTH EVERY 6 HOURS AS NEEDED FOR WHEEZE OR SHORTNESS OF BREATH 05/21/21   Willo Mini, NP  baclofen  (LIORESAL ) 10 MG tablet TAKE 1 TABLET BY MOUTH AT BEDTIME AS NEEDED FOR MUSCLE SPASMS 07/16/21   Willo Mini, NP  diazepam  (VALIUM ) 5 MG tablet Take 1 tablet (5 mg total) by mouth once as needed for up to 1 dose for muscle spasms. Prior to dentist appointments 06/21/22   Willo Mini, NP    Family History Family History  Adopted: Yes  Problem Relation Age of Onset   Heart attack Mother    Cancer Son        osteogenic carcinoma    Social History Social History   Tobacco Use   Smoking status: Never   Smokeless tobacco: Never  Vaping Use   Vaping status: Never Used  Substance Use Topics   Alcohol use: Never   Drug use: Never     Allergies   Onion and Red dye #40 [red dye #40 (allura red)]   Review of Systems Review of Systems  Genitourinary:  Positive for dysuria and  urgency.  All other systems reviewed and are negative.    Physical Exam Triage Vital Signs ED Triage Vitals  Encounter Vitals Group     BP      Girls Systolic BP Percentile      Girls Diastolic BP Percentile      Boys Systolic BP Percentile      Boys Diastolic BP Percentile      Pulse      Resp      Temp      Temp src      SpO2      Weight      Height      Head Circumference      Peak Flow      Pain Score      Pain Loc      Pain Education      Exclude from Growth Chart    No data found.  Updated Vital Signs BP (!) 187/88   Pulse 72   Temp 97.7 F (36.5 C)   Resp 17   SpO2 98%    Physical Exam Vitals and nursing note reviewed.  Constitutional:      Appearance: Normal appearance. She is obese. She is not ill-appearing.  HENT:     Head: Normocephalic and atraumatic.     Mouth/Throat:     Mouth: Mucous membranes are moist.      Pharynx: Oropharynx is clear.  Eyes:     Extraocular Movements: Extraocular movements intact.     Conjunctiva/sclera: Conjunctivae normal.     Pupils: Pupils are equal, round, and reactive to light.  Cardiovascular:     Rate and Rhythm: Normal rate and regular rhythm.     Pulses: Normal pulses.     Heart sounds: Normal heart sounds.  Pulmonary:     Effort: Pulmonary effort is normal.     Breath sounds: Normal breath sounds. No wheezing, rhonchi or rales.  Abdominal:     Tenderness: There is no right CVA tenderness or left CVA tenderness.  Musculoskeletal:        General: Normal range of motion.     Cervical back: Normal range of motion and neck supple.  Skin:    General: Skin is warm and dry.  Neurological:     General: No focal deficit present.     Mental Status: She is alert and oriented to person, place, and time. Mental status is at baseline.  Psychiatric:        Mood and Affect: Mood normal.        Behavior: Behavior normal.      UC Treatments / Results  Labs (all labs ordered are listed, but only abnormal results are displayed) Labs Reviewed  POCT URINALYSIS DIP (MANUAL ENTRY) - Abnormal; Notable for the following components:      Result Value   Color, UA orange (*)    Clarity, UA cloudy (*)    Glucose, UA =250 (*)    Bilirubin, UA small (*)    Ketones, POC UA small (15) (*)    Blood, UA trace-intact (*)    Protein Ur, POC >=300 (*)    Urobilinogen, UA >=8.0 (*)    Nitrite, UA Positive (*)    Leukocytes, UA Large (3+) (*)    All other components within normal limits  URINE CULTURE    EKG   Radiology No results found.  Procedures Procedures (including critical Jones time)  Medications Ordered in UC Medications - No data to display  Initial  Impression / Assessment and Plan / UC Course  I have reviewed the triage vital signs and the nursing notes.  Pertinent labs & imaging results that were available during my Jones of the patient were reviewed by me  and considered in my medical decision making (see chart for details).     MDM: 1.  Acute cystitis with hematuria-Rx'd cefdinir 300 mg capsule: Take 1 capsule twice daily x 7 days, UA revealed above, urine culture ordered. Advised patient of UA results and to take medication as directed with food to completion.  Encouraged to increase daily water  intake to 64 ounces per day while taking this medication.  Advised we will follow-up with urine culture results once received.  Advised if symptoms worsen and/or unresolved please follow-up with your PCP, [facility] urology, or here for further evaluation.  Patient discharged home, hemodynamically stable. Final Clinical Impressions(s) / UC Diagnoses   Final diagnoses:  Acute cystitis with hematuria     Discharge Instructions      Advised patient of UA results and to take medication as directed with food to completion.  Encouraged to increase daily water  intake to 64 ounces per day while taking this medication.  Advised we will follow-up with urine culture results once received.  Advised if symptoms worsen and/or unresolved please follow-up with your PCP, [facility] urology, or here for further evaluation.     ED Prescriptions     Medication Sig Dispense Auth. Provider   cefdinir (OMNICEF) 300 MG capsule Take 1 capsule (300 mg total) by mouth 2 (two) times daily for 7 days. 14 capsule Selma Mink, FNP      PDMP not reviewed this encounter.   Teddy Sharper, FNP 09/30/23 1009

## 2023-09-30 NOTE — ED Triage Notes (Signed)
 Pt presents to uc with co dysuria and urinary frequency for 3 days with new onset of chills last night pt reports she took otc medication last night.

## 2023-10-02 LAB — URINE CULTURE: Culture: >=100000 — AB

## 2023-10-03 ENCOUNTER — Ambulatory Visit (HOSPITAL_COMMUNITY): Payer: Self-pay

## 2023-10-27 DIAGNOSIS — K080 Exfoliation of teeth due to systemic causes: Secondary | ICD-10-CM | POA: Diagnosis not present

## 2023-11-08 ENCOUNTER — Encounter: Payer: Self-pay | Admitting: Sports Medicine

## 2023-12-20 DIAGNOSIS — K080 Exfoliation of teeth due to systemic causes: Secondary | ICD-10-CM | POA: Diagnosis not present

## 2024-01-16 ENCOUNTER — Ambulatory Visit
Admission: EM | Admit: 2024-01-16 | Discharge: 2024-01-16 | Disposition: A | Discharge status: Home / Self Care | Attending: Family Medicine | Admitting: Family Medicine

## 2024-01-16 DIAGNOSIS — R0989 Other specified symptoms and signs involving the circulatory and respiratory systems: Secondary | ICD-10-CM | POA: Diagnosis not present

## 2024-01-16 DIAGNOSIS — J069 Acute upper respiratory infection, unspecified: Secondary | ICD-10-CM | POA: Diagnosis not present

## 2024-01-16 MED ORDER — PREDNISONE 20 MG PO TABS: ORAL_TABLET | ORAL | 0 refills | Status: DC

## 2024-01-16 MED ORDER — DOXYCYCLINE HYCLATE 100 MG PO CAPS
100.0000 mg | ORAL_CAPSULE | Freq: Two times a day (BID) | ORAL | 0 refills | Status: AC
Start: 2024-01-16 — End: 2024-01-23

## 2024-01-16 NOTE — ED Triage Notes (Signed)
 Pt present with c/o nasal congestion and headache congestion x three weeks. Pt states she has never had nasal congestion or allergies. States the congestion has no affected her breathing. Pt states she feels congestion in her chest.

## 2024-01-16 NOTE — Discharge Instructions (Addendum)
 Advised patient take medications as directed with food to completion.  Advised patient to take prednisone  with first dose of doxycycline  for the next 5 of 7 days.  Encouraged increase daily water  intake to 64 ounces per day while taking these medications.  Advised if symptoms worsen and/or unresolved please follow-up with your PCP or here for further evaluation.

## 2024-01-16 NOTE — ED Provider Notes (Signed)
 Toni Jones    CSN: [id number] Arrival date & time: 01/16/24  1145      History   Chief Complaint Chief Complaint  Patient presents with   Nasal Congestion   Headache   Facial Pain    HPI Toni Jones is a 63 y.o. female.   HPI 63 year old female presents with chest and nasal congestion, and headache for 3 weeks.  PMH significant for HTN, chronic bronchitis, and flesh eating bacteria.  Past Medical History:  Diagnosis Date   Arthritis    Asthma    Bronchitis    Chronic bronchitis (HCC)    Flesh-eating bacteria (HCC)    [place]   Foot fracture, left    Hypertension    Pneumonia    hx of    PONV (postoperative nausea and vomiting)     Patient Active Problem List   Diagnosis Date Noted   Trigger thumb, left thumb 05/08/2021   Status post total replacement of right hip 05/12/2020   Primary osteoarthritis of right hip 04/25/2020   Status post total replacement of left hip 02/18/2020   Arthritis    Asthma    Bronchitis    Chronic bronchitis (HCC)    Flesh-eating bacteria (HCC)    Foot fracture, left    Bilateral thigh pain 01/29/2020   Essential hypertension 01/29/2020    Past Surgical History:  Procedure Laterality Date   APPENDECTOMY     left foot surgery      MANDIBLE SURGERY     TONSILLECTOMY     TOTAL HIP ARTHROPLASTY Left 02/18/2020   Procedure: LEFT TOTAL HIP ARTHROPLASTY ANTERIOR APPROACH;  Surgeon: Jerri Kay HERO, MD;  Location: [facility];  Service: Orthopedics;  Laterality: Left;   TOTAL HIP ARTHROPLASTY Right 05/12/2020   Procedure: RIGHT TOTAL HIP ARTHROPLASTY ANTERIOR APPROACH;  Surgeon: Jerri Kay HERO, MD;  Location: [facility];  Service: Orthopedics;  Laterality: Right;   WISDOM TOOTH EXTRACTION      OB History   No obstetric history on file.      Home Medications    Prior to Admission medications   Medication Sig Start Date End Date Taking? Authorizing Provider  albuterol  (VENTOLIN  HFA) 108 (90 Base) MCG/ACT inhaler  INHALE 1-2 PUFFS BY MOUTH EVERY 6 HOURS AS NEEDED FOR WHEEZE OR SHORTNESS OF BREATH 05/21/21  Yes Willo Mini, NP  doxycycline  (VIBRAMYCIN ) 100 MG capsule Take 1 capsule (100 mg total) by mouth 2 (two) times daily for 7 days. 01/16/24 01/23/24 Yes Teddy Sharper, FNP  predniSONE  (DELTASONE ) 20 MG tablet Take 3 tabs PO daily x 5 days. 01/16/24  Yes Teddy Sharper, FNP  baclofen  (LIORESAL ) 10 MG tablet TAKE 1 TABLET BY MOUTH AT BEDTIME AS NEEDED FOR MUSCLE SPASMS 07/16/21   Willo Mini, NP  diazepam  (VALIUM ) 5 MG tablet Take 1 tablet (5 mg total) by mouth once as needed for up to 1 dose for muscle spasms. Prior to dentist appointments 06/21/22   Willo Mini, NP    Family History Family History  Adopted: Yes  Problem Relation Age of Onset   Heart attack Mother    Cancer Son        osteogenic carcinoma    Social History Social History   Tobacco Use   Smoking status: Never   Smokeless tobacco: Never  Vaping Use   Vaping status: Never Used  Substance Use Topics   Alcohol use: Never   Drug use: Never     Allergies   Onion and Red  dye #40 [red dye #40 (allura red)]   Review of Systems Review of Systems   Physical Exam Triage Vital Signs ED Triage Vitals  Encounter Vitals Group     BP 01/16/24 1210 (!) 176/104     Girls Systolic BP Percentile --      Girls Diastolic BP Percentile --      Boys Systolic BP Percentile --      Boys Diastolic BP Percentile --      Pulse Rate 01/16/24 1210 70     Resp 01/16/24 1210 15     Temp 01/16/24 1210 98 F (36.7 C)     Temp src --      SpO2 01/16/24 1210 97 %     Weight --      Height --      Head Circumference --      Peak Flow --      Pain Score 01/16/24 1209 5     Pain Loc --      Pain Education --      Exclude from Growth Chart --    No data found.  Updated Vital Signs BP (!) 148/82 (BP Location: Left Arm)   Pulse 70   Temp 98 F (36.7 C)   Resp 15   SpO2 97%   Visual Acuity Right Eye Distance:   Left Eye Distance:    Bilateral Distance:    Right Eye Near:   Left Eye Near:    Bilateral Near:     Physical Exam Vitals and nursing note reviewed.  Constitutional:      Appearance: Normal appearance. She is obese. She is ill-appearing.  HENT:     Head: Normocephalic and atraumatic.     Right Ear: Tympanic membrane, ear canal and external ear normal.     Left Ear: Tympanic membrane, ear canal and external ear normal.     Mouth/Throat:     Mouth: Mucous membranes are moist.     Pharynx: Oropharynx is clear.  Eyes:     Extraocular Movements: Extraocular movements intact.     Conjunctiva/sclera: Conjunctivae normal.     Pupils: Pupils are equal, round, and reactive to light.  Cardiovascular:     Rate and Rhythm: Normal rate and regular rhythm.     Pulses: Normal pulses.     Heart sounds: Normal heart sounds.  Pulmonary:     Effort: Pulmonary effort is normal.     Breath sounds: Normal breath sounds. No wheezing, rhonchi or rales.     Comments: Infrequent nonproductive cough on exam Musculoskeletal:        General: Normal range of motion.  Skin:    General: Skin is warm.  Neurological:     General: No focal deficit present.     Mental Status: She is alert and oriented to person, place, and time. Mental status is at baseline.      UC Treatments / Results  Labs (all labs ordered are listed, but only abnormal results are displayed) Labs Reviewed - No data to display  EKG   Radiology No results found.  Procedures Procedures (including critical Jones time)  Medications Ordered in UC Medications - No data to display  Initial Impression / Assessment and Plan / UC Course  I have reviewed the triage vital signs and the nursing notes.  Pertinent labs & imaging results that were available during my Jones of the patient were reviewed by me and considered in my medical decision making (see chart for details).  MDM: 1.  Acute URI-Rx'd doxycycline  100 mg capsule: Take 1 capsule twice daily  x 7 days; 2.  Chest congestion-Rx'd prednisone  20 mg tablet: Take 3 tablets p.o. daily x 5 days. Advised patient take medications as directed with food to completion.  Advised patient to take prednisone  with first dose of doxycycline  for the next 5 of 7 days.  Encouraged increase daily water  intake to 64 ounces per day while taking these medications.  Advised if symptoms worsen and/or unresolved please follow-up with your PCP or here for further evaluation.  A work note provided to patient prior to discharge per her request.  Patient discharged home, hemodynamically stable. Final Clinical Impressions(s) / UC Diagnoses   Final diagnoses:  Chest congestion  Acute URI     Discharge Instructions      Advised patient take medications as directed with food to completion.  Advised patient to take prednisone  with first dose of doxycycline  for the next 5 of 7 days.  Encouraged increase daily water  intake to 64 ounces per day while taking these medications.  Advised if symptoms worsen and/or unresolved please follow-up with your PCP or here for further evaluation.     ED Prescriptions     Medication Sig Dispense Auth. Provider   doxycycline  (VIBRAMYCIN ) 100 MG capsule Take 1 capsule (100 mg total) by mouth 2 (two) times daily for 7 days. 14 capsule Qusai Kem, FNP   predniSONE  (DELTASONE ) 20 MG tablet Take 3 tabs PO daily x 5 days. 15 tablet Bohden Dung, FNP      PDMP not reviewed this encounter.   Teddy Sharper, FNP 01/16/24 1327

## 2024-01-17 ENCOUNTER — Telehealth: Payer: Self-pay | Admitting: Emergency Medicine

## 2024-01-17 ENCOUNTER — Telehealth: Payer: Self-pay | Admitting: Family Medicine

## 2024-01-17 MED ORDER — ALBUTEROL SULFATE HFA 108 (90 BASE) MCG/ACT IN AERS: 1.0000 | INHALATION_SPRAY | Freq: Four times a day (QID) | RESPIRATORY_TRACT | 0 refills | Status: DC | PRN

## 2024-01-17 NOTE — Telephone Encounter (Signed)
 Patient requesting albuterol  inhaler for SOB/wheezing.  Inhaler sent to pharmacy per patient request.

## 2024-01-17 NOTE — Telephone Encounter (Signed)
 Spoke w/patient states that she is doing better after resting.  Patient is requesting a refill on her Albuterol  Inhaler, will send the request to [facility] for review.  Pharmacy on file.

## 2024-01-31 ENCOUNTER — Ambulatory Visit
Admission: EM | Admit: 2024-01-31 | Discharge: 2024-01-31 | Disposition: A | Discharge status: Home / Self Care | Attending: Family Medicine | Admitting: Family Medicine

## 2024-01-31 ENCOUNTER — Ambulatory Visit

## 2024-01-31 ENCOUNTER — Other Ambulatory Visit: Payer: Self-pay

## 2024-01-31 DIAGNOSIS — R059 Cough, unspecified: Secondary | ICD-10-CM

## 2024-01-31 DIAGNOSIS — R0602 Shortness of breath: Secondary | ICD-10-CM

## 2024-01-31 DIAGNOSIS — R0989 Other specified symptoms and signs involving the circulatory and respiratory systems: Secondary | ICD-10-CM | POA: Diagnosis not present

## 2024-01-31 DIAGNOSIS — R0981 Nasal congestion: Secondary | ICD-10-CM

## 2024-01-31 DIAGNOSIS — J4541 Moderate persistent asthma with (acute) exacerbation: Secondary | ICD-10-CM | POA: Diagnosis not present

## 2024-01-31 MED ORDER — METHYLPREDNISOLONE SODIUM SUCC 125 MG IJ SOLR
125.0000 mg | Freq: Once | INTRAMUSCULAR | Status: AC
  Administered 2024-01-31: 125 mg via INTRAMUSCULAR

## 2024-01-31 MED ORDER — IPRATROPIUM-ALBUTEROL 0.5-2.5 (3) MG/3ML IN SOLN
3.0000 mL | RESPIRATORY_TRACT | Status: AC
  Administered 2024-01-31: 3 mL via RESPIRATORY_TRACT

## 2024-01-31 NOTE — ED Notes (Signed)
 Pt refused Covid and Flu test ordered. Provider aware.

## 2024-01-31 NOTE — Discharge Instructions (Addendum)
 Patient advised of chest x-ray results with image and hard copies provided to patient prior to discharge.  Advised patient to please follow-up with PCP (for possible CT of chest with contrast) and/or pulmonology (for PFT) if symptoms worsen and/or unresolved.

## 2024-01-31 NOTE — ED Triage Notes (Signed)
 Pt presenting with c/o non productive cough, congestion, SOB x 1,1/2 weeks. Pt stated that she was seen 1,1/2 weeks ago for URI and was prescribed ABT and Prednisone  which she completed. Pt stated that symptoms persists. Pt stated that she uses an Albuterol  Inhaler for her SOB with minimal effectiveness.

## 2024-01-31 NOTE — ED Provider Notes (Signed)
 Toni Jones [facility]    CSN: [id number] Arrival date & time: 01/31/24  9185      History   Chief Complaint Chief Complaint  Patient presents with   Cough    HPI Toni Jones is a 63 y.o. female.   HPI 63 year old female presents with productive cough, shortness of breath for 1.5 to 2 weeks.  Patient was evaluated by me on 01/16/2024.  Please see epic for that encounter note.  Patient reports using rescue inhaler daily for shortness of breath.  PMH significant for chronic bronchitis, HTN, and asthma.      Past Medical History:  Diagnosis Date   Arthritis    Asthma    Bronchitis    Chronic bronchitis (HCC)    Flesh-eating bacteria (HCC)    [place]   Foot fracture, left    Hypertension    Pneumonia    hx of    PONV (postoperative nausea and vomiting)     Patient Active Problem List   Diagnosis Date Noted   Trigger thumb, left thumb 05/08/2021   Status post total replacement of right hip 05/12/2020   Primary osteoarthritis of right hip 04/25/2020   Status post total replacement of left hip 02/18/2020   Arthritis    Asthma    Bronchitis    Chronic bronchitis (HCC)    Flesh-eating bacteria (HCC)    Foot fracture, left    Bilateral thigh pain 01/29/2020   Essential hypertension 01/29/2020    Past Surgical History:  Procedure Laterality Date   APPENDECTOMY     left foot surgery      MANDIBLE SURGERY     TONSILLECTOMY     TOTAL HIP ARTHROPLASTY Left 02/18/2020   Procedure: LEFT TOTAL HIP ARTHROPLASTY ANTERIOR APPROACH;  Surgeon: Jerri Kay HERO, MD;  Location: [facility];  Service: Orthopedics;  Laterality: Left;   TOTAL HIP ARTHROPLASTY Right 05/12/2020   Procedure: RIGHT TOTAL HIP ARTHROPLASTY ANTERIOR APPROACH;  Surgeon: Jerri Kay HERO, MD;  Location: [facility];  Service: Orthopedics;  Laterality: Right;   WISDOM TOOTH EXTRACTION      OB History   No obstetric history on file.      Home Medications    Prior to Admission medications   Medication  Sig Start Date End Date Taking? Authorizing Provider  albuterol  (VENTOLIN  HFA) 108 (90 Base) MCG/ACT inhaler INHALE 1-2 PUFFS BY MOUTH EVERY 6 HOURS AS NEEDED FOR WHEEZE OR SHORTNESS OF BREATH 05/21/21   Willo Mini, NP  albuterol  (VENTOLIN  HFA) 108 (90 Base) MCG/ACT inhaler Inhale 1-2 puffs into the lungs every 6 (six) hours as needed for wheezing or shortness of breath. 01/17/24   Teddy Sharper, FNP  baclofen  (LIORESAL ) 10 MG tablet TAKE 1 TABLET BY MOUTH AT BEDTIME AS NEEDED FOR MUSCLE SPASMS 07/16/21   Willo Mini, NP  diazepam  (VALIUM ) 5 MG tablet Take 1 tablet (5 mg total) by mouth once as needed for up to 1 dose for muscle spasms. Prior to dentist appointments 06/21/22   Willo Mini, NP    Family History Family History  Adopted: Yes  Problem Relation Age of Onset   Heart attack Mother    Cancer Son        osteogenic carcinoma    Social History Social History   Tobacco Use   Smoking status: Never   Smokeless tobacco: Never  Vaping Use   Vaping status: Never Used  Substance Use Topics   Alcohol use: Never   Drug use: Never  Allergies   Onion and Red dye #40 [red dye #40 (allura red)]   Review of Systems Review of Systems  Respiratory:  Positive for cough and shortness of breath.   All other systems reviewed and are negative.    Physical Exam Triage Vital Signs ED Triage Vitals  Encounter Vitals Group     BP      Girls Systolic BP Percentile      Girls Diastolic BP Percentile      Boys Systolic BP Percentile      Boys Diastolic BP Percentile      Pulse      Resp      Temp      Temp src      SpO2      Weight      Height      Head Circumference      Peak Flow      Pain Score      Pain Loc      Pain Education      Exclude from Growth Chart    No data found.  Updated Vital Signs BP (!) 173/92 (BP Location: Right Arm)   Pulse 96   Temp 97.6 F (36.4 C) (Oral)   Resp 18   Ht 5' 5 (1.651 m)   Wt 200 lb (90.7 kg)   SpO2 98%   BMI 33.28 kg/m    Visual Acuity Right Eye Distance:   Left Eye Distance:   Bilateral Distance:    Right Eye Near:   Left Eye Near:    Bilateral Near:     Physical Exam Vitals and nursing note reviewed.  Constitutional:      General: She is not in acute distress.    Appearance: Normal appearance. She is obese. She is ill-appearing. She is not toxic-appearing or diaphoretic.  HENT:     Head: Normocephalic and atraumatic.     Right Ear: Tympanic membrane, ear canal and external ear normal.     Left Ear: Tympanic membrane, ear canal and external ear normal.     Mouth/Throat:     Mouth: Mucous membranes are moist.     Pharynx: Oropharynx is clear.  Eyes:     Extraocular Movements: Extraocular movements intact.     Conjunctiva/sclera: Conjunctivae normal.     Pupils: Pupils are equal, round, and reactive to light.  Cardiovascular:     Rate and Rhythm: Normal rate and regular rhythm.     Heart sounds: Normal heart sounds.  Pulmonary:     Effort: Pulmonary effort is normal.     Breath sounds: Normal breath sounds. No wheezing, rhonchi or rales.     Comments: Persistent nonproductive coughing on exam; post Solu-Medrol /DuoNeb: Improved air movement throughout mild inspiratory wheeze over left anterior noted Musculoskeletal:        General: Normal range of motion.     Cervical back: Neck supple.  Skin:    General: Skin is warm and dry.  Neurological:     General: No focal deficit present.     Mental Status: She is alert and oriented to person, place, and time. Mental status is at baseline.  Psychiatric:        Mood and Affect: Mood normal.        Behavior: Behavior normal.      UC Treatments / Results  Labs (all labs ordered are listed, but only abnormal results are displayed) Labs Reviewed - No data to display   EKG   Radiology  DG Chest 2 View Result Date: 01/31/2024 CLINICAL DATA:  Several week history of cough, congestion, shortness of breath EXAM: CHEST - 2 VIEW COMPARISON:   Chest radiograph dated 08/09/2021 FINDINGS: Normal lung volumes. No focal consolidations. No pleural effusion or pneumothorax. The heart size and mediastinal contours are within normal limits. No acute osseous abnormality. IMPRESSION: No active cardiopulmonary disease. Electronically Signed   By: Limin  Xu M.D.   On: 01/31/2024 10:15    Procedures Procedures (including critical care time)  Medications Ordered in UC Medications  methylPREDNISolone  sodium succinate (SOLU-MEDROL ) 125 mg/2 mL injection 125 mg (125 mg Intramuscular Given 01/31/24 1012)  ipratropium-albuterol  (DUONEB) 0.5-2.5 (3) MG/3ML nebulizer solution 3 mL (3 mLs Nebulization Given 01/31/24 1012)    Initial Impression / Assessment and Plan / UC Course  I have reviewed the triage vital signs and the nursing notes.  Pertinent labs & imaging results that were available during my care of the patient were reviewed by me and considered in my medical decision making (see chart for details).     MDM: 1.  Moderate persistent asthma with acute exacerbation-IM Solu-Medrol  125 mg given once in clinic, DuoNeb 0.5/2.53 mg / 3 mL nebulizer given once in clinic; 2.  Cough, unspecified type-DG chest view revealed above, patient advised with hardcopy and images provided. Patient advised of chest x-ray results with image and hard copies provided to patient prior to discharge.  Advised patient to please follow-up with PCP (for possible CT of chest with contrast) and/or pulmonology (for PFT) if symptoms worsen and/or unresolved.  Patient discharged home, hemodynamically stable.  Work note provided to patient prior to discharge today. Final Clinical Impressions(s) / UC Diagnoses   Final diagnoses:  Cough, unspecified type  Moderate persistent asthma with acute exacerbation     Discharge Instructions      Patient advised of chest x-ray results with image and hard copies provided to patient prior to discharge.  Advised patient to please  follow-up with PCP (for possible CT of chest with contrast) and/or pulmonology (for PFT) if symptoms worsen and/or unresolved.     ED Prescriptions   None    PDMP not reviewed this encounter.   Teddy Sharper, FNP 01/31/24 1123

## 2024-02-06 ENCOUNTER — Ambulatory Visit: Admitting: Medical-Surgical

## 2024-02-06 ENCOUNTER — Encounter: Payer: Self-pay | Admitting: Medical-Surgical

## 2024-02-06 VITALS — BP 156/84 | HR 92 | Resp 20 | Ht 65.0 in | Wt 207.6 lb

## 2024-02-06 DIAGNOSIS — I10 Essential (primary) hypertension: Secondary | ICD-10-CM | POA: Diagnosis not present

## 2024-02-06 DIAGNOSIS — J4540 Moderate persistent asthma, uncomplicated: Secondary | ICD-10-CM | POA: Diagnosis not present

## 2024-02-06 MED ORDER — BACLOFEN 10 MG PO TABS: ORAL_TABLET | ORAL | 1 refills | Status: AC

## 2024-02-06 MED ORDER — IPRATROPIUM-ALBUTEROL 0.5-2.5 (3) MG/3ML IN SOLN: 3.0000 mL | Freq: Four times a day (QID) | RESPIRATORY_TRACT | 3 refills | Status: DC | PRN

## 2024-02-06 MED ORDER — ALBUTEROL SULFATE HFA 108 (90 BASE) MCG/ACT IN AERS: 1.0000 | INHALATION_SPRAY | Freq: Four times a day (QID) | RESPIRATORY_TRACT | 5 refills | Status: AC | PRN

## 2024-02-06 NOTE — Progress Notes (Signed)
        Established patient visit   History of Present Illness   Discussed the use of AI scribe software for clinical note transcription with the patient, who gave verbal consent to proceed.  History of Present Illness   Toni Jones is a 63 year old female with asthma who presents with a persistent cough and wheezing.  Asthma exacerbation and respiratory symptoms - Persistent cough and wheezing described as 'tight' - Long-standing history of asthma with recurrent exacerbations - Relief achieved with emergency nebulizer treatment and DuoNeb - Uses albuterol  inhaler received a couple of weeks ago; requires a new inhaler due to frequent use - Previously treated with prednisone  60 mg for five days, which provided symptom relief but caused her to feel 'amped up' and oral antibiotics - Occasional use of Claritin and NeilMed sinus rinse - Missed work from November 10 to November 12 and November 25 to December 1 due to respiratory symptoms - Expresses concern about job security and being considered 'unfit for duty' secondary to chronic respiratory condition and work absences     Physical Exam   Physical Exam Vitals reviewed.  Constitutional:      General: She is not in acute distress.    Appearance: Normal appearance. She is obese. She is not ill-appearing.  HENT:     Head: Normocephalic and atraumatic.  Cardiovascular:     Rate and Rhythm: Normal rate and regular rhythm.     Pulses: Normal pulses.     Heart sounds: Normal heart sounds. No murmur heard.    No friction rub. No gallop.  Pulmonary:     Effort: Pulmonary effort is normal. No respiratory distress.     Breath sounds: Normal breath sounds. No wheezing.  Skin:    General: Skin is warm and dry.  Neurological:     Mental Status: She is alert and oriented to person, place, and time.  Psychiatric:        Mood and Affect: Mood normal.        Behavior: Behavior normal.        Thought Content: Thought content normal.         Judgment: Judgment normal.    Assessment & Plan     Moderate persistent asthma Recent exacerbation with inadequate control on rescue inhaler. CT scan unnecessary due to normal chest x-ray. Discussed daily preventative medication benefits. Provided Trelegy sample for trial. - Prescribed DuoNeb nebulizer solution, 3 mL vial, use every 6 hours as needed. - Provided Trelegy 100mcg daily sample for trial use. - Refilled albuterol  inhaler. - Provided nebulizer machine for home use. - Educated on nebulizer and Trelegy use. - FMLA forms completed, originals returned to patient during the visit.  Essential hypertension Blood pressure elevated during visit. No home monitoring. Discussed importance of monitoring and potential medication adjustment. - Discussed home blood pressure monitoring options.  - Patient reports her BP is fine when she is not at the doctor's office.     Follow up   Return for annual physical exam at your convenience. __________________________________ Zada FREDRIK Palin, DNP, APRN, FNP-BC Primary Care and Sports Medicine [facility] [place]

## 2024-02-22 ENCOUNTER — Ambulatory Visit: Admitting: Medical-Surgical

## 2024-02-22 ENCOUNTER — Encounter: Payer: Self-pay | Admitting: Medical-Surgical

## 2024-02-22 VITALS — BP 127/70 | HR 91 | Temp 98.7°F | Resp 20 | Ht 65.0 in | Wt 204.0 lb

## 2024-02-22 DIAGNOSIS — J40 Bronchitis, not specified as acute or chronic: Secondary | ICD-10-CM

## 2024-02-22 DIAGNOSIS — J329 Chronic sinusitis, unspecified: Secondary | ICD-10-CM | POA: Diagnosis not present

## 2024-02-22 MED ORDER — AZITHROMYCIN 250 MG PO TABS: ORAL_TABLET | ORAL | 0 refills | Status: AC

## 2024-02-22 MED ORDER — PREDNISONE 50 MG PO TABS: ORAL_TABLET | ORAL | 0 refills | Status: DC

## 2024-02-22 NOTE — Progress Notes (Cosign Needed Addendum)
° °  Acute Office Visit  Subjective:     Patient ID: Toni Jones, female    DOB: 03-12-1960, 63 y.o.   MRN: [id number]  No chief complaint on file.   HPI Patient is in today for cough w/ yellow mucus, nasal congestion, and runny nose that started last night. Recently treated for a sinus infection on 01/16/24 with antibiotic and prednisone . She states that she continues to have sinus congestion. Hx of chronic bronchitis. Reports having a fever last night of 101. She has taken tylenol  which brought the fever down. Last dose of tylenol  was this morning around 8 am. She has been using her Trelegy inhaler daily and uses the albuterol  in the evenings. She states that she has not seen any improvement with the Trelegy. Declines POCT Flu/Covid swab.  Review of Systems  Constitutional: Negative.   HENT:  Positive for congestion.   Eyes: Negative.   Respiratory:  Positive for cough and sputum production.   Cardiovascular: Negative.   Gastrointestinal: Negative.   Genitourinary: Negative.   Musculoskeletal: Negative.   Skin: Negative.   Neurological: Negative.   Endo/Heme/Allergies: Negative.   Psychiatric/Behavioral: Negative.         Objective:    BP 127/70   Pulse 91  BP Readings from Last 3 Encounters:  02/22/24 127/70  02/06/24 (!) 156/84  01/31/24 (!) 173/92     Physical Exam Vitals and nursing note reviewed.  Constitutional:      General: She is not in acute distress.    Appearance: Normal appearance.  HENT:     Nose: Congestion and rhinorrhea present.     Mouth/Throat:     Mouth: Mucous membranes are moist.     Pharynx: Oropharynx is clear.  Cardiovascular:     Rate and Rhythm: Normal rate and regular rhythm.     Pulses: Normal pulses.     Heart sounds: Normal heart sounds.  Pulmonary:     Effort: Pulmonary effort is normal.     Breath sounds: Wheezing present.  Neurological:     General: No focal deficit present.     Mental Status: She is alert and oriented to  person, place, and time.  Psychiatric:        Mood and Affect: Mood normal.        Behavior: Behavior normal.        Thought Content: Thought content normal.        Judgment: Judgment normal.     No results found for any visits on 02/22/24.      Assessment & Plan:   1. Sinobronchitis (Primary) -Declined POCT Flu/Covid Swab -Rx for Z-Pack sent to the pharmacy -Rx for prednisone  burst  -Sample for Beztri given to the patient   Meds ordered this encounter  Medications   azithromycin  (ZITHROMAX ) 250 MG tablet    Sig: Take 2 tablets on day 1, then 1 tablet daily on days 2 through 5    Dispense:  6 tablet    Refill:  0   predniSONE  (DELTASONE ) 50 MG tablet    Sig: Take one tablet once daily for 5 days    Dispense:  5 tablet    Refill:  0   budesonide-glycopyrrolate-formoterol (BREZTRI AEROSPHERE) 160-9-4.8 MCG/ACT AERO inhaler    Sig: Inhale 2 puffs into the lungs 2 (two) times daily.    Supervising Provider:   ALVIA BRING [[id number]]    Return if symptoms worsen or fail to improve.  Derrek JINNY Freund, NP Student

## 2024-02-23 NOTE — Progress Notes (Signed)
 Medical screening examination/treatment was performed by qualified clinical staff member and as supervising provider I was immediately available for consultation/collaboration. I have reviewed documentation and agree with assessment and plan.  Thayer Ohm, DNP, APRN, FNP-BC [facility] and [facility] Medicine

## 2024-03-13 ENCOUNTER — Encounter: Admitting: Medical-Surgical

## 2024-03-21 ENCOUNTER — Encounter: Payer: Self-pay | Admitting: Medical-Surgical

## 2024-03-21 ENCOUNTER — Ambulatory Visit (INDEPENDENT_AMBULATORY_CARE_PROVIDER_SITE_OTHER): Admitting: Medical-Surgical

## 2024-03-21 VITALS — BP 168/89 | HR 107 | Temp 97.8°F | Resp 20 | Ht 65.0 in | Wt 207.0 lb

## 2024-03-21 DIAGNOSIS — I10 Essential (primary) hypertension: Secondary | ICD-10-CM

## 2024-03-21 DIAGNOSIS — E639 Nutritional deficiency, unspecified: Secondary | ICD-10-CM | POA: Diagnosis not present

## 2024-03-21 DIAGNOSIS — Z0000 Encounter for general adult medical examination without abnormal findings: Secondary | ICD-10-CM

## 2024-03-21 MED ORDER — PREDNISONE 50 MG PO TABS: 50.0000 mg | ORAL_TABLET | Freq: Every day | ORAL | 0 refills | Status: AC

## 2024-03-21 NOTE — Patient Instructions (Signed)
 Preventive Care 65-65 Years Old, Female  Preventive care refers to lifestyle choices and visits with your health care provider that can promote health and wellness. Preventive care visits are also called wellness exams.  What can I expect for my preventive care visit?  Counseling  Your health care provider may ask you questions about your:  Medical history, including:  Past medical problems.  Family medical history.  Pregnancy history.  Current health, including:  Menstrual cycle.  Method of birth control.  Emotional well-being.  Home life and relationship well-being.  Sexual activity and sexual health.  Lifestyle, including:  Alcohol, nicotine or tobacco, and drug use.  Access to firearms.  Diet, exercise, and sleep habits.  Work and work Astronomer.  Sunscreen use.  Safety issues such as seatbelt and bike helmet use.  Physical exam  Your health care provider will check your:  Height and weight. These may be used to calculate your BMI (body mass index). BMI is a measurement that tells if you are at a healthy weight.  Waist circumference. This measures the distance around your waistline. This measurement also tells if you are at a healthy weight and may help predict your risk of certain diseases, such as type 2 diabetes and high blood pressure.  Heart rate and blood pressure.  Body temperature.  Skin for abnormal spots.  What immunizations do I need?    Vaccines are usually given at various ages, according to a schedule. Your health care provider will recommend vaccines for you based on your age, medical history, and lifestyle or other factors, such as travel or where you work.  What tests do I need?  Screening  Your health care provider may recommend screening tests for certain conditions. This may include:  Lipid and cholesterol levels.  Diabetes screening. This is done by checking your blood sugar (glucose) after you have not eaten for a while (fasting).  Pelvic exam and Pap test.  Hepatitis B test.  Hepatitis C  test.  HIV (human immunodeficiency virus) test.  STI (sexually transmitted infection) testing, if you are at risk.  Lung cancer screening.  Colorectal cancer screening.  Mammogram. Talk with your health care provider about when you should start having regular mammograms. This may depend on whether you have a family history of breast cancer.  BRCA-related cancer screening. This may be done if you have a family history of breast, ovarian, tubal, or peritoneal cancers.  Bone density scan. This is done to screen for osteoporosis.  Talk with your health care provider about your test results, treatment options, and if necessary, the need for more tests.  Follow these instructions at home:  Eating and drinking    Eat a diet that includes fresh fruits and vegetables, whole grains, lean protein, and low-fat dairy products.  Take vitamin and mineral supplements as recommended by your health care provider.  Do not drink alcohol if:  Your health care provider tells you not to drink.  You are pregnant, may be pregnant, or are planning to become pregnant.  If you drink alcohol:  Limit how much you have to 0-1 drink a day.  Know how much alcohol is in your drink. In the [place] one drink equals one 12 oz bottle of beer (355 mL), one 5 oz glass of wine (148 mL), or one 1 oz glass of hard liquor (44 mL).  Lifestyle  Brush your teeth every morning and night with fluoride toothpaste. Floss one time each day.  Exercise for at least  30 minutes 5 or more days each week.  Do not use any products that contain nicotine or tobacco. These products include cigarettes, chewing tobacco, and vaping devices, such as e-cigarettes. If you need help quitting, ask your health care provider.  Do not use drugs.  If you are sexually active, practice safe sex. Use a condom or other form of protection to prevent STIs.  If you do not wish to become pregnant, use a form of birth control. If you plan to become pregnant, see your health care provider for a  prepregnancy visit.  Take aspirin only as told by your health care provider. Make sure that you understand how much to take and what form to take. Work with your health care provider to find out whether it is safe and beneficial for you to take aspirin daily.  Find healthy ways to manage stress, such as:  Meditation, yoga, or listening to music.  Journaling.  Talking to a trusted person.  Spending time with friends and family.  Minimize exposure to UV radiation to reduce your risk of skin cancer.  Safety  Always wear your seat belt while driving or riding in a vehicle.  Do not drive:  If you have been drinking alcohol. Do not ride with someone who has been drinking.  When you are tired or distracted.  While texting.  If you have been using any mind-altering substances or drugs.  Wear a helmet and other protective equipment during sports activities.  If you have firearms in your house, make sure you follow all gun safety procedures.  Seek help if you have been physically or sexually abused.  What's next?  Visit your health care provider once a year for an annual wellness visit.  Ask your health care provider how often you should have your eyes and teeth checked.  Stay up to date on all vaccines.  This information is not intended to replace advice given to you by your health care provider. Make sure you discuss any questions you have with your health care provider.  Document Revised: 08/20/2020 Document Reviewed: 08/20/2020  Elsevier Patient Education  2024 [employer].

## 2024-03-21 NOTE — Progress Notes (Signed)
 "  Complete physical exam  Patient: Toni Jones   DOB: 12/23/1960   64 y.o. Female  MRN: [id number]  Subjective:    Chief Complaint  Patient presents with   Annual Exam   Cough    From having Pneumonia     Toni Jones is a 64 y.o. female who presents today for a complete physical exam. She reports consuming a general diet. Lifting weights, Tai chi, and body weight exercises for exercise. She generally feels fairly well. She reports sleeping well. She does not have additional problems to discuss today.    Most recent fall risk assessment:    03/21/2024   11:11 AM  Fall Risk   Falls in the past year? 0  Number falls in past yr: 0  Injury with Fall? 0  Risk for fall due to : No Fall Risks  Follow up Falls evaluation completed     Most recent depression screenings:    03/21/2024   11:11 AM 02/06/2024   11:37 AM  PHQ 2/9 Scores  PHQ - 2 Score 0 0  PHQ- 9 Score 0 2    Vision:Within last year and Dental: No current dental problems and Receives regular dental care    Patient Care Team: Willo Mini, NP as PCP - General (Nurse Practitioner) Tobb, Kardie, DO as PCP - Cardiology (Cardiology)   Show/hide medication list[1]  Review of Systems  Constitutional:  Positive for malaise/fatigue. Negative for chills, fever and weight loss.  HENT:  Negative for congestion, ear pain, hearing loss, sinus pain and sore throat.   Eyes:  Negative for blurred vision, photophobia and pain.  Respiratory:  Positive for cough and sputum production. Negative for shortness of breath and wheezing.   Cardiovascular:  Negative for chest pain, palpitations and leg swelling.  Gastrointestinal:  Negative for abdominal pain, constipation, diarrhea, heartburn, nausea and vomiting.  Genitourinary:  Negative for dysuria, frequency and urgency.  Musculoskeletal:  Negative for falls and neck pain.  Skin:  Negative for itching and rash.  Neurological:  Negative for dizziness, weakness and headaches.   Endo/Heme/Allergies:  Negative for polydipsia. Does not bruise/bleed easily.  Psychiatric/Behavioral:  Negative for depression, substance abuse and suicidal ideas. The patient is not nervous/anxious and does not have insomnia.      Objective:    BP (!) 149/74   Pulse (!) 105   Temp 97.8 F (36.6 C) (Oral)   Resp 20   Ht 5' 5 (1.651 m)   Wt 207 lb 0.6 oz (93.9 kg)   SpO2 96%   BMI 34.45 kg/m    Physical Exam Constitutional:      General: She is not in acute distress.    Appearance: Normal appearance. She is not ill-appearing.  HENT:     Head: Normocephalic and atraumatic.     Right Ear: Tympanic membrane, ear canal and external ear normal. There is no impacted cerumen.     Left Ear: Tympanic membrane, ear canal and external ear normal. There is no impacted cerumen.     Nose: Nose normal. No congestion or rhinorrhea.     Mouth/Throat:     Mouth: Mucous membranes are moist.     Pharynx: No oropharyngeal exudate or posterior oropharyngeal erythema.  Eyes:     General: No scleral icterus.       Right eye: No discharge.        Left eye: No discharge.     Extraocular Movements: Extraocular movements intact.     Conjunctiva/sclera: Conjunctivae  normal.     Pupils: Pupils are equal, round, and reactive to light.  Neck:     Thyroid : No thyromegaly.     Vascular: No carotid bruit or JVD.     Trachea: Trachea normal.  Cardiovascular:     Rate and Rhythm: Normal rate and regular rhythm.     Pulses: Normal pulses.     Heart sounds: Normal heart sounds. No murmur heard.    No friction rub. No gallop.  Pulmonary:     Effort: Pulmonary effort is normal. No respiratory distress.     Breath sounds: Normal breath sounds. No wheezing.  Abdominal:     General: Bowel sounds are normal. There is no distension.     Palpations: Abdomen is soft.     Tenderness: There is no abdominal tenderness. There is no guarding.  Musculoskeletal:        General: Normal range of motion.     Cervical  back: Normal range of motion and neck supple.  Lymphadenopathy:     Cervical: No cervical adenopathy.  Skin:    General: Skin is warm and dry.  Neurological:     Mental Status: She is alert and oriented to person, place, and time.     Cranial Nerves: No cranial nerve deficit.  Psychiatric:        Mood and Affect: Mood normal.        Behavior: Behavior normal.        Thought Content: Thought content normal.        Judgment: Judgment normal.   No results found for any visits on 03/21/24.     Assessment & Plan:    Routine Health Maintenance and Physical Exam  Immunization History  Administered Date(s) Administered   PFIZER(Purple Top)SARS-COV-2 Vaccination 05/30/2019, 06/20/2019    Health Maintenance  Topic Date Due   HIV Screening  Never done   Hepatitis C Screening  Never done   DTaP/Tdap/Td (1 - Tdap) Never done   Pneumococcal Vaccine: 50+ Years (1 of 2 - PCV) Never done   Hepatitis B Vaccines 19-59 Average Risk  Aged Out   HPV VACCINES  Aged Out   Meningococcal B Vaccine  Aged Out   COVID-19 Vaccine  Discontinued   Zoster Vaccines- Shingrix  Discontinued    Discussed health benefits of physical activity, and encouraged her to engage in regular exercise appropriate for her age and condition.  1. Annual physical exam (Primary) Checking labs as below. UTD on preventative care. Wellness information provided with AVS. - CBC with Differential/Platelet - Comprehensive metabolic panel with GFR - Lipid panel  2. Nutritional deficiency Notable nutritional deficiency due to food intolerances and necessary limitations. Checking labs as below. - CBC with Differential/Platelet - Comprehensive metabolic panel with GFR - Magnesium  - Vitamin B12 - VITAMIN D  25 Hydroxy (Vit-D Deficiency, Fractures)  3. Essential hypertension BP elevated today. Reports having a Monster drink before her appointment which likely explains the BP elevation. Recheck of BP 168/89. Prior visits with  well controlled readings so no medication initiated today. Updating labs today.  - Recommend avoiding Monster drinks. - Encouraged home monitoring of BP with a goal of less than 130/80. - CBC with Differential/Platelet - Comprehensive metabolic panel with GFR - Lipid panel  Return in about 6 months (around 09/18/2024) for chronic disease follow up.   Sylvester Minton, NP      [1]  Outpatient Medications Prior to Visit  Medication Sig   albuterol  (VENTOLIN  HFA) 108 (90 Base) MCG/ACT  inhaler Inhale 1-2 puffs into the lungs every 6 (six) hours as needed for wheezing or shortness of breath.   baclofen  (LIORESAL ) 10 MG tablet TAKE 1 TABLET BY MOUTH AT BEDTIME AS NEEDED FOR MUSCLE SPASMS   budesonide-glycopyrrolate-formoterol (BREZTRI AEROSPHERE) 160-9-4.8 MCG/ACT AERO inhaler Inhale 2 puffs into the lungs 2 (two) times daily.   diazepam  (VALIUM ) 5 MG tablet Take 1 tablet (5 mg total) by mouth once as needed for up to 1 dose for muscle spasms. Prior to dentist appointments   [DISCONTINUED] ipratropium-albuterol  (DUONEB) 0.5-2.5 (3) MG/3ML SOLN Take 3 mLs by nebulization every 6 (six) hours as needed.   [DISCONTINUED] predniSONE  (DELTASONE ) 50 MG tablet Take one tablet once daily for 5 days   No facility-administered medications prior to visit.   "

## 2024-03-22 ENCOUNTER — Ambulatory Visit: Payer: Self-pay | Admitting: Medical-Surgical

## 2024-03-22 LAB — CBC WITH DIFFERENTIAL/PLATELET
Basophils Absolute: 0.1 x10E3/uL (ref 0.0–0.2)
Basos: 1 %
EOS (ABSOLUTE): 0.3 x10E3/uL (ref 0.0–0.4)
Eos: 4 %
Hematocrit: 44.6 % (ref 34.0–46.6)
Hemoglobin: 14.7 g/dL (ref 11.1–15.9)
Immature Grans (Abs): 0 x10E3/uL (ref 0.0–0.1)
Immature Granulocytes: 0 %
Lymphocytes Absolute: 1.9 x10E3/uL (ref 0.7–3.1)
Lymphs: 25 %
MCH: 27.4 pg (ref 26.6–33.0)
MCHC: 33 g/dL (ref 31.5–35.7)
MCV: 83 fL (ref 79–97)
Monocytes Absolute: 0.5 x10E3/uL (ref 0.1–0.9)
Monocytes: 7 %
Neutrophils Absolute: 4.9 x10E3/uL (ref 1.4–7.0)
Neutrophils: 63 %
Platelets: 356 x10E3/uL (ref 150–450)
RBC: 5.36 x10E6/uL — ABNORMAL HIGH (ref 3.77–5.28)
RDW: 13.7 % (ref 11.7–15.4)
WBC: 7.8 x10E3/uL (ref 3.4–10.8)

## 2024-03-22 LAB — LIPID PANEL
Chol/HDL Ratio: 4.1 ratio (ref 0.0–4.4)
Cholesterol, Total: 252 mg/dL — ABNORMAL HIGH (ref 100–199)
HDL: 62 mg/dL
LDL Chol Calc (NIH): 149 mg/dL — ABNORMAL HIGH (ref 0–99)
Triglycerides: 227 mg/dL — ABNORMAL HIGH (ref 0–149)
VLDL Cholesterol Cal: 41 mg/dL — ABNORMAL HIGH (ref 5–40)

## 2024-03-22 LAB — COMPREHENSIVE METABOLIC PANEL WITH GFR
ALT: 26 IU/L (ref 0–32)
AST: 21 IU/L (ref 0–40)
Albumin: 4.6 g/dL (ref 3.9–4.9)
Alkaline Phosphatase: 82 IU/L (ref 49–135)
BUN/Creatinine Ratio: 15 (ref 12–28)
BUN: 11 mg/dL (ref 8–27)
Bilirubin Total: 0.3 mg/dL (ref 0.0–1.2)
CO2: 20 mmol/L (ref 20–29)
Calcium: 9.8 mg/dL (ref 8.7–10.3)
Chloride: 99 mmol/L (ref 96–106)
Creatinine, Ser: 0.75 mg/dL (ref 0.57–1.00)
Globulin, Total: 2.4 g/dL (ref 1.5–4.5)
Glucose: 98 mg/dL (ref 70–99)
Potassium: 4.1 mmol/L (ref 3.5–5.2)
Sodium: 142 mmol/L (ref 134–144)
Total Protein: 7 g/dL (ref 6.0–8.5)
eGFR: 89 mL/min/1.73

## 2024-03-22 LAB — MAGNESIUM: Magnesium: 2 mg/dL (ref 1.6–2.3)

## 2024-03-22 LAB — VITAMIN D 25 HYDROXY (VIT D DEFICIENCY, FRACTURES): Vit D, 25-Hydroxy: 23.1 ng/mL — ABNORMAL LOW (ref 30.0–100.0)

## 2024-03-22 LAB — VITAMIN B12: Vitamin B-12: 1016 pg/mL (ref 232–1245)

## 2024-04-20 ENCOUNTER — Ambulatory Visit: Admitting: Medical-Surgical
